# Patient Record
Sex: Male | Born: 1935 | Race: Black or African American | Hispanic: No | Marital: Married | Smoking: Never smoker
Health system: Southern US, Community
[De-identification: ages and names within clinical notes are randomized; demographics above are authoritative.]

## PROBLEM LIST (undated history)

## (undated) DIAGNOSIS — M109 Gout, unspecified: Secondary | ICD-10-CM

## (undated) DIAGNOSIS — I639 Cerebral infarction, unspecified: Secondary | ICD-10-CM

## (undated) DIAGNOSIS — M1990 Unspecified osteoarthritis, unspecified site: Secondary | ICD-10-CM

## (undated) DIAGNOSIS — T7840XA Allergy, unspecified, initial encounter: Secondary | ICD-10-CM

## (undated) HISTORY — DX: Cerebral infarction, unspecified: I63.9

## (undated) HISTORY — DX: Allergy, unspecified, initial encounter: T78.40XA

## (undated) HISTORY — DX: Unspecified osteoarthritis, unspecified site: M19.90

## (undated) HISTORY — PX: EYE SURGERY: SHX253

---

## 1999-01-25 ENCOUNTER — Encounter: Payer: Self-pay | Admitting: Emergency Medicine

## 1999-01-25 ENCOUNTER — Emergency Department (HOSPITAL_COMMUNITY): Admission: EM | Admit: 1999-01-25 | Discharge: 1999-01-26 | Payer: Self-pay | Admitting: Emergency Medicine

## 1999-05-10 ENCOUNTER — Encounter: Payer: Self-pay | Admitting: General Surgery

## 1999-05-10 ENCOUNTER — Encounter: Admission: RE | Admit: 1999-05-10 | Discharge: 1999-05-10 | Payer: Self-pay | Admitting: General Surgery

## 1999-05-11 ENCOUNTER — Ambulatory Visit (HOSPITAL_BASED_OUTPATIENT_CLINIC_OR_DEPARTMENT_OTHER): Admission: RE | Admit: 1999-05-11 | Discharge: 1999-05-11 | Payer: Self-pay | Admitting: General Surgery

## 2000-06-22 ENCOUNTER — Emergency Department (HOSPITAL_COMMUNITY): Admission: EM | Admit: 2000-06-22 | Discharge: 2000-06-22 | Payer: Self-pay | Admitting: Emergency Medicine

## 2000-06-22 ENCOUNTER — Encounter: Payer: Self-pay | Admitting: Emergency Medicine

## 2000-08-29 ENCOUNTER — Ambulatory Visit (HOSPITAL_COMMUNITY): Admission: RE | Admit: 2000-08-29 | Discharge: 2000-08-29 | Payer: Self-pay | Admitting: Cardiovascular Disease

## 2001-07-13 ENCOUNTER — Ambulatory Visit (HOSPITAL_COMMUNITY): Admission: RE | Admit: 2001-07-13 | Discharge: 2001-07-13 | Payer: Self-pay | Admitting: *Deleted

## 2001-07-13 ENCOUNTER — Encounter: Payer: Self-pay | Admitting: *Deleted

## 2003-09-28 ENCOUNTER — Ambulatory Visit: Admission: RE | Admit: 2003-09-28 | Discharge: 2003-12-27 | Payer: Self-pay | Admitting: Radiation Oncology

## 2003-10-17 ENCOUNTER — Ambulatory Visit: Admission: RE | Admit: 2003-10-17 | Discharge: 2003-10-17 | Payer: Self-pay | Admitting: Family Medicine

## 2003-10-25 ENCOUNTER — Encounter: Admission: RE | Admit: 2003-10-25 | Discharge: 2003-10-25 | Payer: Self-pay | Admitting: Urology

## 2003-11-22 ENCOUNTER — Ambulatory Visit (HOSPITAL_COMMUNITY): Admission: RE | Admit: 2003-11-22 | Discharge: 2003-11-22 | Payer: Self-pay | Admitting: Urology

## 2003-11-22 ENCOUNTER — Ambulatory Visit (HOSPITAL_BASED_OUTPATIENT_CLINIC_OR_DEPARTMENT_OTHER): Admission: RE | Admit: 2003-11-22 | Discharge: 2003-11-22 | Payer: Self-pay | Admitting: Urology

## 2003-12-28 ENCOUNTER — Ambulatory Visit: Admission: RE | Admit: 2003-12-28 | Discharge: 2004-01-11 | Payer: Self-pay | Admitting: Radiation Oncology

## 2005-12-02 IMAGING — CR DG CHEST 2V
2 series · 2 of 2 positions shown · non-contrast
Comparison: none

CLINICAL DATA: Prostate cancer.  Pre-op respiratory exam. 
 TWO VIEW CHEST: 
 Two views of the chest without prior films for comparison demonstrate cardiac enlargement.  The pulmonary hila are slightly prominent but I think this is just due to prominent pulmonary vascularity.  No definite adenopathy.  Slight increased vascular markings in the lower lung zones bilaterally but no infiltrates, edema, or effusions.  No pulmonary lesions.  No pleural effusions.  Bony structures are grossly normal .

[view not recorded (1 of 2)]
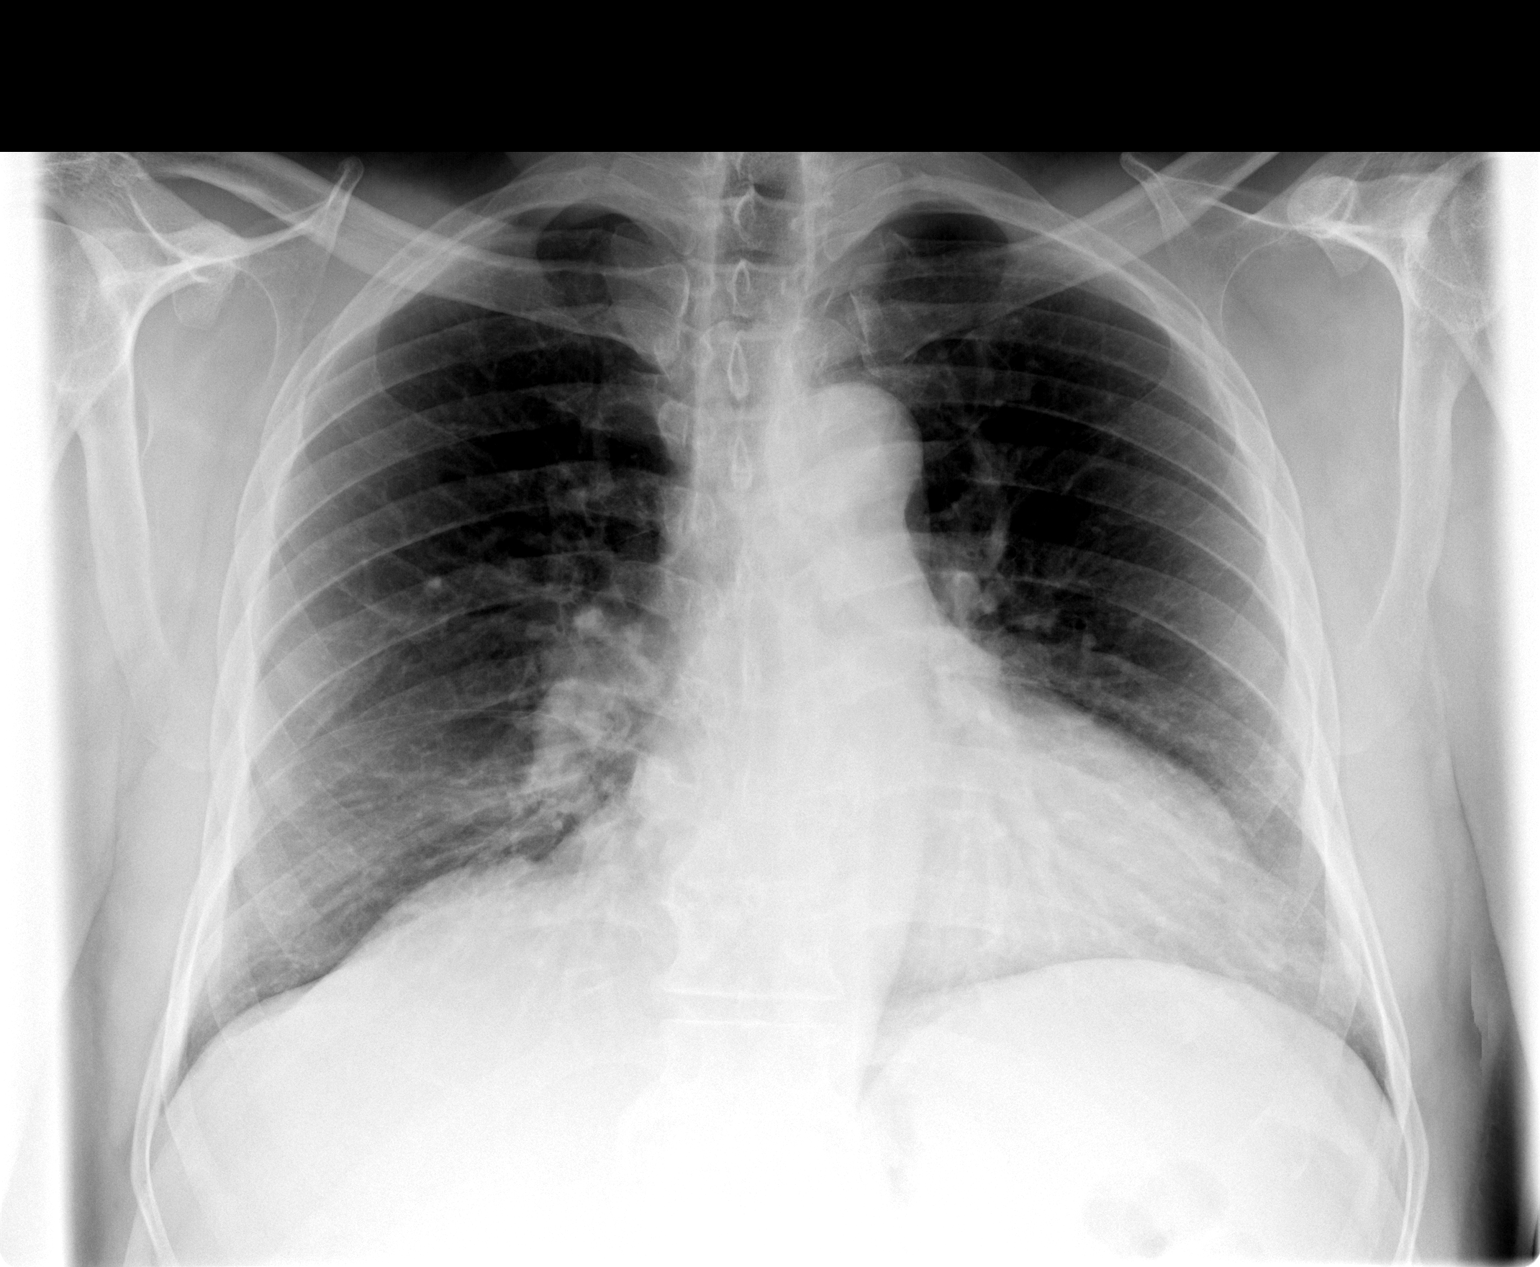

[view not recorded (2 of 2)]
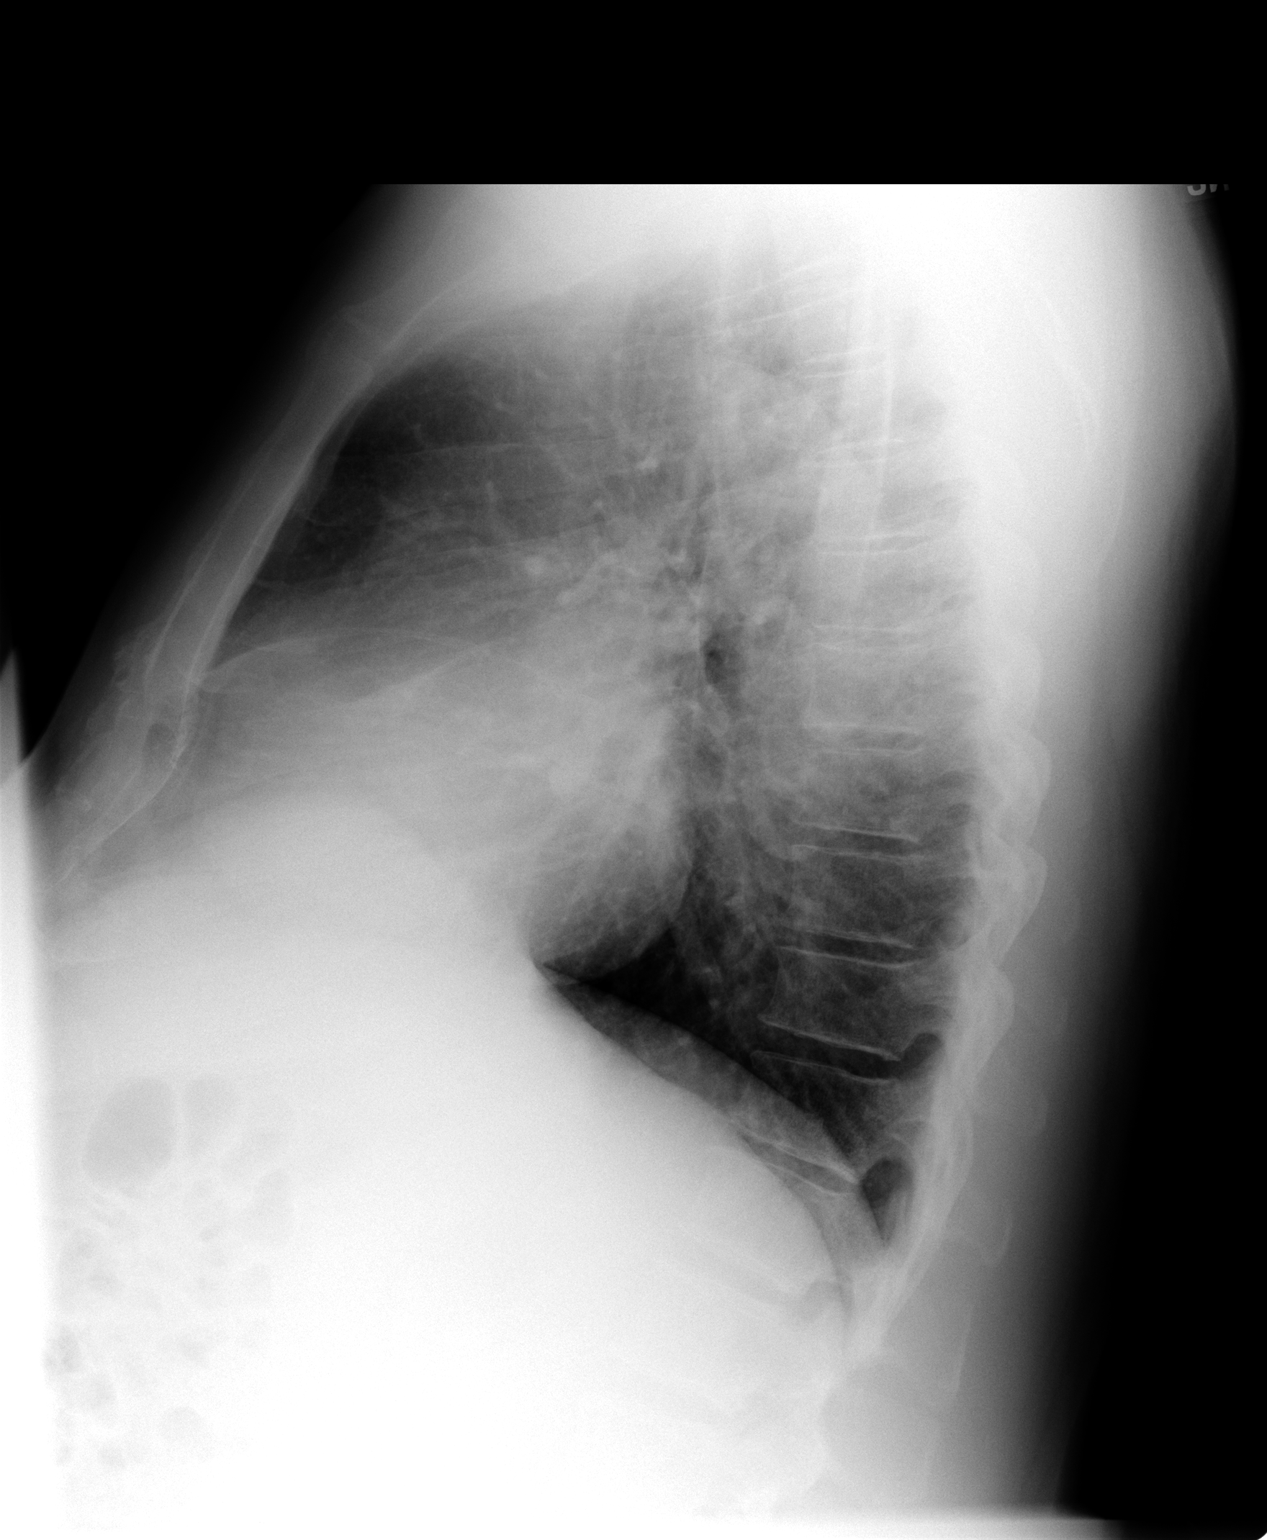

[2 of 2 positions shown; findings below may reference images not displayed]

IMPRESSION: 1.  Mild cardiac enlargement. 
 2.  Mild vascular prominence but no acute pulmonary findings.

## 2015-05-24 ENCOUNTER — Encounter (HOSPITAL_COMMUNITY): Payer: Self-pay | Admitting: Neurology

## 2015-05-24 ENCOUNTER — Emergency Department (HOSPITAL_COMMUNITY): Payer: Commercial Managed Care - HMO

## 2015-05-24 ENCOUNTER — Emergency Department (HOSPITAL_COMMUNITY)
Admit: 2015-05-24 | Discharge: 2015-05-24 | Disposition: A | Discharge status: Home / Self Care | Payer: Commercial Managed Care - HMO

## 2015-05-24 ENCOUNTER — Ambulatory Visit (INDEPENDENT_AMBULATORY_CARE_PROVIDER_SITE_OTHER): Payer: Commercial Managed Care - HMO | Admitting: Family Medicine

## 2015-05-24 ENCOUNTER — Inpatient Hospital Stay (HOSPITAL_COMMUNITY)
Admission: EM | Admit: 2015-05-24 | Discharge: 2015-05-26 | DRG: 554 | Disposition: A | Discharge status: Home / Self Care | Payer: Commercial Managed Care - HMO | Attending: Internal Medicine | Admitting: Internal Medicine

## 2015-05-24 VITALS — BP 136/84 | HR 65 | Temp 99.3°F | Resp 16

## 2015-05-24 DIAGNOSIS — Z8739 Personal history of other diseases of the musculoskeletal system and connective tissue: Secondary | ICD-10-CM

## 2015-05-24 DIAGNOSIS — M109 Gout, unspecified: Secondary | ICD-10-CM

## 2015-05-24 DIAGNOSIS — M659 Synovitis and tenosynovitis, unspecified: Secondary | ICD-10-CM | POA: Diagnosis present

## 2015-05-24 DIAGNOSIS — I499 Cardiac arrhythmia, unspecified: Secondary | ICD-10-CM

## 2015-05-24 DIAGNOSIS — Z79899 Other long term (current) drug therapy: Secondary | ICD-10-CM | POA: Diagnosis not present

## 2015-05-24 DIAGNOSIS — R0600 Dyspnea, unspecified: Secondary | ICD-10-CM

## 2015-05-24 DIAGNOSIS — M25431 Effusion, right wrist: Secondary | ICD-10-CM

## 2015-05-24 DIAGNOSIS — Z8673 Personal history of transient ischemic attack (TIA), and cerebral infarction without residual deficits: Secondary | ICD-10-CM | POA: Diagnosis not present

## 2015-05-24 DIAGNOSIS — M10021 Idiopathic gout, right elbow: Secondary | ICD-10-CM | POA: Diagnosis present

## 2015-05-24 DIAGNOSIS — R531 Weakness: Secondary | ICD-10-CM | POA: Diagnosis not present

## 2015-05-24 DIAGNOSIS — R6510 Systemic inflammatory response syndrome (SIRS) of non-infectious origin without acute organ dysfunction: Secondary | ICD-10-CM | POA: Diagnosis present

## 2015-05-24 DIAGNOSIS — M79601 Pain in right arm: Secondary | ICD-10-CM | POA: Diagnosis not present

## 2015-05-24 DIAGNOSIS — Z7982 Long term (current) use of aspirin: Secondary | ICD-10-CM | POA: Diagnosis not present

## 2015-05-24 DIAGNOSIS — Z8639 Personal history of other endocrine, nutritional and metabolic disease: Secondary | ICD-10-CM

## 2015-05-24 DIAGNOSIS — R8290 Unspecified abnormal findings in urine: Secondary | ICD-10-CM | POA: Diagnosis not present

## 2015-05-24 DIAGNOSIS — M1990 Unspecified osteoarthritis, unspecified site: Secondary | ICD-10-CM | POA: Diagnosis present

## 2015-05-24 DIAGNOSIS — R509 Fever, unspecified: Secondary | ICD-10-CM | POA: Diagnosis present

## 2015-05-24 HISTORY — DX: Gout, unspecified: M10.9

## 2015-05-24 LAB — POCT CBC
Granulocyte percent: 69.5 %G (ref 37–80)
HCT, POC: 38.9 % — AB (ref 43.5–53.7)
Hemoglobin: 13.4 g/dL — AB (ref 14.1–18.1)
Lymph, poc: 1.2 (ref 0.6–3.4)
MCH, POC: 27.6 pg (ref 27–31.2)
MCHC: 34.3 g/dL (ref 31.8–35.4)
MCV: 80.4 fL (ref 80–97)
MID (cbc): 0.6 (ref 0–0.9)
MPV: 7 fL (ref 0–99.8)
POC Granulocyte: 4.2 (ref 2–6.9)
POC LYMPH PERCENT: 20.7 %L (ref 10–50)
POC MID %: 9.8 %M (ref 0–12)
Platelet Count, POC: 144 10*3/uL (ref 142–424)
RBC: 4.84 M/uL (ref 4.69–6.13)
RDW, POC: 17 %
WBC: 6 10*3/uL (ref 4.6–10.2)

## 2015-05-24 LAB — I-STAT TROPONIN, ED: Troponin i, poc: 0 ng/mL (ref 0.00–0.08)

## 2015-05-24 LAB — URINE MICROSCOPIC-ADD ON: BACTERIA UA: NONE SEEN

## 2015-05-24 LAB — SYNOVIAL CELL COUNT + DIFF, W/ CRYSTALS
MONOCYTE-MACROPHAGE-SYNOVIAL FLUID: 4 % — AB (ref 50–90)
NEUTROPHIL, SYNOVIAL: 96 % — AB (ref 0–25)
WBC, Synovial: 10530 /mm3 — ABNORMAL HIGH (ref 0–200)

## 2015-05-24 LAB — COMPREHENSIVE METABOLIC PANEL
ALT: 27 U/L (ref 17–63)
AST: 57 U/L — AB (ref 15–41)
Albumin: 3.5 g/dL (ref 3.5–5.0)
Alkaline Phosphatase: 59 U/L (ref 38–126)
Anion gap: 10 (ref 5–15)
BUN: 14 mg/dL (ref 6–20)
CHLORIDE: 99 mmol/L — AB (ref 101–111)
CO2: 29 mmol/L (ref 22–32)
CREATININE: 1.07 mg/dL (ref 0.61–1.24)
Calcium: 8.9 mg/dL (ref 8.9–10.3)
GFR calc Af Amer: >60 mL/min (ref >60)
GFR calc non Af Amer: >60 mL/min (ref >60)
Glucose, Bld: 108 mg/dL — ABNORMAL HIGH (ref 65–99)
POTASSIUM: 3.5 mmol/L (ref 3.5–5.1)
SODIUM: 138 mmol/L (ref 135–145)
Total Bilirubin: 2.2 mg/dL — ABNORMAL HIGH (ref 0.3–1.2)
Total Protein: 7.1 g/dL (ref 6.5–8.1)

## 2015-05-24 LAB — GRAM STAIN

## 2015-05-24 LAB — URINALYSIS, ROUTINE W REFLEX MICROSCOPIC
GLUCOSE, UA: NEGATIVE mg/dL
Ketones, ur: 40 mg/dL — AB
Nitrite: NEGATIVE
PH: 5.5 (ref 5.0–8.0)
PROTEIN: 100 mg/dL — AB
Specific Gravity, Urine: 1.023 (ref 1.005–1.030)

## 2015-05-24 LAB — CBC WITH DIFFERENTIAL/PLATELET
BASOS ABS: 0 10*3/uL (ref 0.0–0.1)
Basophils Relative: 0 %
EOS ABS: 0 10*3/uL (ref 0.0–0.7)
Eosinophils Relative: 0 %
HCT: 39.7 % (ref 39.0–52.0)
Hemoglobin: 12.6 g/dL — ABNORMAL LOW (ref 13.0–17.0)
LYMPHS ABS: 1 10*3/uL (ref 0.7–4.0)
LYMPHS PCT: 19 %
MCH: 26.9 pg (ref 26.0–34.0)
MCHC: 31.7 g/dL (ref 30.0–36.0)
MCV: 84.6 fL (ref 78.0–100.0)
MONOS PCT: 9 %
Monocytes Absolute: 0.5 10*3/uL (ref 0.1–1.0)
NEUTROS PCT: 72 %
Neutro Abs: 3.8 10*3/uL (ref 1.7–7.7)
PLATELETS: 131 10*3/uL — AB (ref 150–400)
RBC: 4.69 MIL/uL (ref 4.22–5.81)
RDW: 15.6 % — AB (ref 11.5–15.5)
WBC: 5.3 10*3/uL (ref 4.0–10.5)

## 2015-05-24 LAB — GLUCOSE, POCT (MANUAL RESULT ENTRY): POC Glucose: 124 mg/dl — AB (ref 70–99)

## 2015-05-24 LAB — I-STAT CG4 LACTIC ACID, ED
LACTIC ACID, VENOUS: 0.68 mmol/L (ref 0.5–2.0)
Lactic Acid, Venous: 1.21 mmol/L (ref 0.5–2.0)

## 2015-05-24 MED ORDER — ASPIRIN EC 81 MG PO TBEC
162.0000 mg | DELAYED_RELEASE_TABLET | Freq: Every day | ORAL | Status: DC
  Administered 2015-05-25 – 2015-05-26 (×2): 162 mg via ORAL
  Filled 2015-05-24 (×3): qty 2

## 2015-05-24 MED ORDER — VANCOMYCIN HCL IN DEXTROSE 750-5 MG/150ML-% IV SOLN
750.0000 mg | Freq: Two times a day (BID) | INTRAVENOUS | Status: DC
  Administered 2015-05-25 – 2015-05-26 (×3): 750 mg via INTRAVENOUS
  Filled 2015-05-24 (×4): qty 150

## 2015-05-24 MED ORDER — MORPHINE SULFATE (PF) 2 MG/ML IV SOLN: 1.0000 mg | INTRAVENOUS | Status: DC | PRN

## 2015-05-24 MED ORDER — SODIUM CHLORIDE 0.9 % IV BOLUS (SEPSIS)
1000.0000 mL | Freq: Once | INTRAVENOUS | Status: AC
  Administered 2015-05-24: 1000 mL via INTRAVENOUS

## 2015-05-24 MED ORDER — ACETAMINOPHEN 325 MG PO TABS: 650.0000 mg | ORAL_TABLET | Freq: Four times a day (QID) | ORAL | Status: DC | PRN

## 2015-05-24 MED ORDER — METHYLPREDNISOLONE SODIUM SUCC 40 MG IJ SOLR
40.0000 mg | Freq: Every day | INTRAMUSCULAR | Status: DC
  Administered 2015-05-25: 40 mg via INTRAVENOUS
  Filled 2015-05-24: qty 1

## 2015-05-24 MED ORDER — ONDANSETRON HCL 4 MG PO TABS: 4.0000 mg | ORAL_TABLET | Freq: Four times a day (QID) | ORAL | Status: DC | PRN

## 2015-05-24 MED ORDER — VANCOMYCIN HCL IN DEXTROSE 1-5 GM/200ML-% IV SOLN
1000.0000 mg | Freq: Once | INTRAVENOUS | Status: AC
  Administered 2015-05-24: 1000 mg via INTRAVENOUS
  Filled 2015-05-24: qty 200

## 2015-05-24 MED ORDER — ACETAMINOPHEN 650 MG RE SUPP: 650.0000 mg | Freq: Four times a day (QID) | RECTAL | Status: DC | PRN

## 2015-05-24 MED ORDER — ONDANSETRON HCL 4 MG/2ML IJ SOLN: 4.0000 mg | Freq: Four times a day (QID) | INTRAMUSCULAR | Status: DC | PRN

## 2015-05-24 MED ORDER — LIDOCAINE HCL 1 % IJ SOLN
20.0000 mL | Freq: Once | INTRAMUSCULAR | Status: AC
  Administered 2015-05-24: 20 mL via INTRADERMAL
  Filled 2015-05-24 (×2): qty 20

## 2015-05-24 MED ORDER — SODIUM CHLORIDE 0.9 % IV SOLN
INTRAVENOUS | Status: AC
  Administered 2015-05-25 (×2): via INTRAVENOUS

## 2015-05-24 MED ORDER — ENOXAPARIN SODIUM 40 MG/0.4ML ~~LOC~~ SOLN
40.0000 mg | Freq: Every day | SUBCUTANEOUS | Status: DC
  Administered 2015-05-25 – 2015-05-26 (×2): 40 mg via SUBCUTANEOUS
  Filled 2015-05-24 (×4): qty 0.4

## 2015-05-24 NOTE — Consult Note (Signed)
Reason for Consult: Right elbow pain Referring Physician: [name] is an 79 y.o. male.  HPI: Trevor Bishop is a 79 year old patient with two-week history of H medical onset right elbow pain. Has a known history of gout and has had a gout flare for the past "month" in his feet as well as in his hands. This history is provided by both the patient as well as his daughters who are here with him. He denies any fever or chills but just reports generally progressive right elbow pain. He has not been on antibiotics  Past Medical History  Diagnosis Date  . Allergy   . Arthritis   . Stroke [facility])     Past Surgical History  Procedure Laterality Date  . Eye surgery      No family history on file.  Social History:  reports that he has never smoked. He does not have any smokeless tobacco history on file. He reports that he does not drink alcohol. His drug history is not on file.  Allergies: No Known Allergies  Medications: I have reviewed the patient's current medications.  Results for orders placed or performed during the hospital encounter of 05/24/15 (from the past 48 hour(s))  CBC with Differential     Status: Abnormal   Collection Time: 05/24/15  3:00 PM  Result Value Ref Range   WBC 5.3 4.0 - 10.5 K/uL   RBC 4.69 4.22 - 5.81 MIL/uL   Hemoglobin 12.6 (L) 13.0 - 17.0 g/dL   HCT 39.7 39.0 - 52.0 %   MCV 84.6 78.0 - 100.0 fL   MCH 26.9 26.0 - 34.0 pg   MCHC 31.7 30.0 - 36.0 g/dL   RDW 15.6 (H) 11.5 - 15.5 %   Platelets 131 (L) 150 - 400 K/uL   Neutrophils Relative % 72 %   Neutro Abs 3.8 1.7 - 7.7 K/uL   Lymphocytes Relative 19 %   Lymphs Abs 1.0 0.7 - 4.0 K/uL   Monocytes Relative 9 %   Monocytes Absolute 0.5 0.1 - 1.0 K/uL   Eosinophils Relative 0 %   Eosinophils Absolute 0.0 0.0 - 0.7 K/uL   Basophils Relative 0 %   Basophils Absolute 0.0 0.0 - 0.1 K/uL  Comprehensive metabolic panel     Status: Abnormal   Collection Time: 05/24/15  3:00 PM  Result Value Ref Range    Sodium 138 135 - 145 mmol/L   Potassium 3.5 3.5 - 5.1 mmol/L   Chloride 99 (L) 101 - 111 mmol/L   CO2 29 22 - 32 mmol/L   Glucose, Bld 108 (H) 65 - 99 mg/dL   BUN 14 6 - 20 mg/dL   Creatinine, Ser 1.07 0.61 - 1.24 mg/dL   Calcium 8.9 8.9 - 10.3 mg/dL   Total Protein 7.1 6.5 - 8.1 g/dL   Albumin 3.5 3.5 - 5.0 g/dL   AST 57 (H) 15 - 41 U/L   ALT 27 17 - 63 U/L   Alkaline Phosphatase 59 38 - 126 U/L   Total Bilirubin 2.2 (H) 0.3 - 1.2 mg/dL   GFR calc non Af Amer >60 >60 mL/min   GFR calc Af Amer >60 >60 mL/min    Comment: (NOTE) The eGFR has been calculated using the CKD EPI equation. This calculation has not been validated in all clinical situations. eGFR's persistently <60 mL/min signify possible Chronic Kidney Disease.    Anion gap 10 5 - 15  I-stat troponin, ED     Status:  None   Collection Time: 05/24/15  3:08 PM  Result Value Ref Range   Troponin i, poc 0.00 0.00 - 0.08 ng/mL   Comment 3            Comment: Due to the release kinetics of cTnI, a negative result within the first hours of the onset of symptoms does not rule out myocardial infarction with certainty. If myocardial infarction is still suspected, repeat the test at appropriate intervals.   I-Stat CG4 Lactic Acid, ED  (not at  [facility])     Status: None   Collection Time: 05/24/15  3:10 PM  Result Value Ref Range   Lactic Acid, Venous 1.21 0.5 - 2.0 mmol/L    Dg Chest 1 View  05/24/2015  CLINICAL DATA:  Gout attack. EXAM: CHEST 1 VIEW COMPARISON:  Oct 25, 2003. FINDINGS: Stable cardiomediastinal silhouette. No pneumothorax or pleural effusion is noted. No acute pulmonary disease is noted. Bony thorax is intact. IMPRESSION: No acute cardiopulmonary abnormality seen. Electronically Signed   By: Marijo Conception, M.D.   On: 05/24/2015 15:30   Dg Elbow Complete Right  05/24/2015  CLINICAL DATA:  79 year old male with right elbow pain and swelling radiating to the humerus. Patient reports a gout. Initial  encounter. EXAM: RIGHT ELBOW - COMPLETE 3+ VIEW COMPARISON:  None. FINDINGS: Evidence of joint effusion. Generalized soft tissue swelling and stranding about the right elbow. Distal right humerus appears intact. There are small degenerative or posttraumatic ossific fragments about the medial epicondyles. Proximal radius and ulna appear intact. No subcutaneous gas. No radiopaque foreign body identified. IMPRESSION: Moderate to severe soft tissue swelling with evidence of joint effusion, but no acute osseous abnormality identified. Electronically Signed   By: Genevie Ann M.D.   On: 05/24/2015 15:27    Review of Systems  Constitutional: Positive for malaise/fatigue.  HENT: Negative.   Eyes: Negative.   Respiratory: Negative.   Cardiovascular: Negative.   Gastrointestinal: Negative.   Genitourinary: Negative.   Musculoskeletal: Positive for joint pain.  Skin: Negative.   Neurological: Negative.   Endo/Heme/Allergies: Negative.   Psychiatric/Behavioral: Negative.    Blood pressure 128/76, pulse 103, temperature 101 F (38.3 C), temperature source Rectal, resp. rate 12, SpO2 95 %. Physical Exam  Constitutional: He appears well-developed.  HENT:  Head: Normocephalic.  Eyes: Pupils are equal, round, and reactive to light.  Neck: Normal range of motion.  Cardiovascular: Normal rate.   Respiratory: Effort normal.  Neurological: He is alert.  Skin: Skin is warm.  Psychiatric: He has a normal mood and affect.   examination of the right elbow demonstrates painful range of motion including pronation supination there is no definitive olecranon fossa fluid collection. There is redness and warmth to the elbow. He also has some swelling in the right wrist which is not present in the left wrist. No proximal lymphadenopathy in the right arm is present grip EPL FPL interosseous is intact no other masses lymphadenopathy or skin vision in the right elbow region  Assessment/Plan: Impression is gout which is  most likely versus infection in the right elbow. Patient does not have a white count but he does have an intra-articular effusion. Plan is to aspirate the joint confirmed that its gout and likely have medical team initiate gout treatment. If his infection and we will proceed with washout but again without fevers chills or white count I think that's highly unlikely especially with 2 week course. The risk and benefits of aspiration injection discussed with the patient  on alcohol elbow joint aspirated the lower approach will see him back in follow-up after analysis performed on the fluid  DEAN,GREGORY SCOTT 05/24/2015, 5:24 PM

## 2015-05-24 NOTE — H&P (Signed)
[facility] History and Physical  Trevor GhaziJesse R Hovater ONG:[id number] DOB: Aug 30, 1935 DOA: 05/24/2015  Referring physician: Ms.Westfall. PCP: No primary care provider on file. [facility]. Specialists: None.  Chief Complaint: Right pain and swelling around the elbow area.  HPI: Trevor Bishop is a 79 y.o. male history of gout presents to the ER because of increasing pain and swelling around the right elbow area and upper extremity. Patient has been having progressive pain and swelling over the last 2 weeks. Patient has history of gout and was prescribed allopurinol 2 months ago but patient stopped taking because of patient felt that his pain increased on taking allopurinol. Patient takes NSAIDs as needed. In the ER patient also was found to be tachycardic and febrile. There was concern for septic joint and orthopedic on-call Dr. August Saucerean was consulted and patient had aspiration of the right elbow joint which is negative for any organism and shows crystals concerning for gout. In addition patient's urine is also concerning for UTI. Patient has been admitted for further management of acute gout with SIRS.   Review of Systems: As presented in the history of presenting illness, rest negative.  Past Medical History  Diagnosis Date  . Allergy   . Arthritis   . Stroke (HCC)   . Gout    Past Surgical History  Procedure Laterality Date  . Eye surgery     Social History:  reports that he has never smoked. He does not have any smokeless tobacco history on file. He reports that he does not drink alcohol. His drug history is not on file. Where does patient live home. Can patient participate in ADLs? Yes.  No Known Allergies  Family History:  Family History  Problem Relation Age of Onset  . Gout Neg Hx   . Diabetes Mellitus II Neg Hx       Prior to Admission medications   Medication Sig Start Date End Date Taking? Authorizing Provider  aspirin EC 81 MG tablet Take 162 mg by mouth daily.    Yes Historical Provider, MD  Menthol, Topical Analgesic, (ICY HOT POWER) 16 % GEL Apply 1 application topically daily as needed (general pain / arm).   Yes Historical Provider, MD  naproxen sodium (ANAPROX) 220 MG tablet Take 440 mg by mouth daily as needed (general pain).   Yes Historical Provider, MD  OVER THE COUNTER MEDICATION Take 1 tablet by mouth daily. "Prostratus"   Yes Historical Provider, MD    Physical Exam: Filed Vitals:   05/24/15 2130 05/24/15 2153 05/24/15 2218 05/24/15 2233  BP: 117/68  157/74   Pulse: 95  102   Temp:  99.8 F (37.7 C) 98.5 F (36.9 C)   TempSrc:  Rectal Oral   Resp: 18  18   Height:      Weight:    87.9 kg (193 lb 12.6 oz)  SpO2: 96%  98%      General:  Moderately built and nourished.  Eyes: Anicteric no pallor.  ENT: No discharge from the ears eyes nose and mouth.  Neck: No mass felt.  Cardiovascular: S1-S2 heard.  Respiratory: No rhonchi or crepitations.  Abdomen: Soft nontender bowel sounds present.  Skin: No rash.  Musculoskeletal: Swelling and tenderness of the right upper extremity. With patient having significant pain on moving right upper extremity. Swelling is mostly in the right elbow area.  Psychiatric: Appears normal.  Neurologic: Alert awake oriented to time place and person. Moves all extremities.  Labs on Admission:  Basic Metabolic Panel:  Recent Labs Lab 05/24/15 1500  NA 138  K 3.5  CL 99*  CO2 29  GLUCOSE 108*  BUN 14  CREATININE 1.07  CALCIUM 8.9   Liver Function Tests:  Recent Labs Lab 05/24/15 1500  AST 57*  ALT 27  ALKPHOS 59  BILITOT 2.2*  PROT 7.1  ALBUMIN 3.5   No results for input(s): LIPASE, AMYLASE in the last 168 hours. No results for input(s): AMMONIA in the last 168 hours. CBC:  Recent Labs Lab 05/24/15 1219 05/24/15 1500  WBC 6.0 5.3  NEUTROABS  --  3.8  HGB 13.4* 12.6*  HCT 38.9* 39.7  MCV 80.4 84.6  PLT  --  131*   Cardiac Enzymes: No results for input(s):  CKTOTAL, CKMB, CKMBINDEX, TROPONINI in the last 168 hours.  BNP (last 3 results) No results for input(s): BNP in the last 8760 hours.  ProBNP (last 3 results) No results for input(s): PROBNP in the last 8760 hours.  CBG: No results for input(s): GLUCAP in the last 168 hours.  Radiological Exams on Admission: Dg Chest 1 View  05/24/2015  CLINICAL DATA:  Gout attack. EXAM: CHEST 1 VIEW COMPARISON:  Oct 25, 2003. FINDINGS: Stable cardiomediastinal silhouette. No pneumothorax or pleural effusion is noted. No acute pulmonary disease is noted. Bony thorax is intact. IMPRESSION: No acute cardiopulmonary abnormality seen. Electronically Signed   By: Lupita Raider, M.D.   On: 05/24/2015 15:30   Dg Elbow Complete Right  05/24/2015  CLINICAL DATA:  79 year old male with right elbow pain and swelling radiating to the humerus. Patient reports a gout. Initial encounter. EXAM: RIGHT ELBOW - COMPLETE 3+ VIEW COMPARISON:  None. FINDINGS: Evidence of joint effusion. Generalized soft tissue swelling and stranding about the right elbow. Distal right humerus appears intact. There are small degenerative or posttraumatic ossific fragments about the medial epicondyles. Proximal radius and ulna appear intact. No subcutaneous gas. No radiopaque foreign body identified. IMPRESSION: Moderate to severe soft tissue swelling with evidence of joint effusion, but no acute osseous abnormality identified. Electronically Signed   By: Odessa Fleming M.D.   On: 05/24/2015 15:27     Assessment/Plan Principal Problem:   SIRS (systemic inflammatory response syndrome) (HCC) Active Problems:   Fever   Gout attack   Acute gout   1. SIRS secondary to acute gouty arthritis involving the right elbow and also possible UTI - until blood cultures and urine cultures are final patient will be on empiric antibiotics. I have also place patient on IV Solu-Medrol for acute gouty arthritis involving the right elbow. Check uric acid levels.  Patient eventually will need to be on uric acid decreasing agents. Since patient is not able to tolerate allopurinol patient probably should be on Uloric if patient's uric acid level is high. Check Dopplers to rule out DVT. 2. Mildly elevated AST - follow-up as outpatient.   DVT Prophylaxis Lovenox.  Code Status: Full code.  Family Communication: Discussed with patient.  Disposition Plan: Admit to inpatient.    Benoit Meech N. [facility] Pager [phone].  If 7PM-7AM, please contact night-coverage [email or web address] Password [email or web address] 05/24/2015, 11:51 PM

## 2015-05-24 NOTE — ED Notes (Signed)
Trevor BloodgoodDonald Bishop (cousin) phone number- [phone]

## 2015-05-24 NOTE — Progress Notes (Addendum)
Subjective:  This chart was scribed for Meredith StaggersJeffrey Shevy Yaney, MD by Stann Oresung-Kai Tsai, Medical Scribe. This patient was seen in room 6 and the patient's care was started 11:36 AM.   Patient ID: Trevor Bishop, male    DOB: [birth year], 79 y.o.   MRN: [id number] Chief Complaint  Patient presents with  . gout flare up    all over    HPI Trevor Bishop is a 79 y.o. male Pt with history of gout is new patient to our office complaining of pain all over that started 3 weeks ago. He was seen by Dr. Renato Gailseed a year ago. His flare up at that time occurred in his leg and his hand. He is taking medication for enlarged prostate. He denies DM.   Pt who is brought in by his cousin states that he has pain in both of his hands with intermittent aching pain in both arms and pain in both legs. He notes some weakness in both legs and his right arm. He was unable to get out of his bed this morning. He usually walks on his own but today he needed assistance of being in a wheelchair. He mentions shortness of breath when getting out of bed starting 2 weeks ago. He isn't able to put any weight on his legs.   He has a history of a stroke about 10 years ago. He denied weakness in his arms and legs at that time. The onset at the time was with slurred speech with abnormal movement. He has headaches occasionally. He denies slurred speech recently. He denies chest pain, fever, chills and dizziness. He also has a tremor in his left hand that's been ongoing for 3 years.   There are no active problems to display for this patient.  Past Medical History  Diagnosis Date  . Allergy   . Arthritis   . Stroke [facility](HCC)    Past Surgical History  Procedure Laterality Date  . Eye surgery     No Known Allergies Prior to Admission medications   Not on File   Social History   Social History  . Marital Status: Married    Spouse Name: N/A  . Number of Children: N/A  . Years of Education: N/A   Occupational History  . Not on file.    Social History Main Topics  . Smoking status: Never Smoker   . Smokeless tobacco: Not on file  . Alcohol Use: Not on file  . Drug Use: Not on file  . Sexual Activity: Not on file   Other Topics Concern  . Not on file   Social History Narrative  . No narrative on file   Review of Systems  Constitutional: Positive for fatigue. Negative for fever and chills.  Respiratory: Positive for shortness of breath. Negative for cough.   Cardiovascular: Negative for chest pain.  Musculoskeletal: Positive for myalgias, arthralgias and gait problem. Negative for back pain.  Neurological: Positive for tremors (left hand), weakness and headaches. Negative for dizziness, syncope and speech difficulty.       Objective:   Physical Exam  Constitutional: He is oriented to person, place, and time. He appears well-developed and well-nourished.  HENT:  Head: Normocephalic and atraumatic.  Eyes: EOM are normal. Pupils are equal, round, and reactive to light.  Neck: Neck supple.  Cardiovascular: Regular rhythm.   Occasional extrasystoles are present. Bradycardia present.   Cap refill <1sec in right finger tips  Pulmonary/Chest: Effort normal. No respiratory distress. He has no  wheezes.  Few coarse Bishop sounds  Musculoskeletal:  Right hand: minimal movement, warmth and swelling in right wrist and hand Dorsal plantar flexion and extension, resistant to standing for further testing Grossly lower extremities equal Right arm: erythema with warmth spreading up the forearm to proximal elbow, mostly laterally  Neurological: He is alert and oriented to person, place, and time.  Equal facial movements, no drooping, speech is intelligible, tremor in left hand  Skin: Skin is warm and dry.  Nursing note and vitals reviewed.   Filed Vitals:   05/24/15 1121  BP: 136/84  Pulse: 65  Temp: 99.3 F (37.4 C)  TempSrc: Oral  Resp: 16  SpO2: 93%   EKG: sinus tachycardia - rate 108, frequent PVC's.     Results for orders placed or performed in visit on 05/24/15  POCT CBC  Result Value Ref Range   WBC 6.0 4.6 - 10.2 K/uL   Lymph, poc 1.2 0.6 - 3.4   POC LYMPH PERCENT 20.7 10 - 50 %L   MID (cbc) 0.6 0 - 0.9   POC MID % 9.8 0 - 12 %M   POC Granulocyte 4.2 2 - 6.9   Granulocyte percent 69.5 37 - 80 %G   RBC 4.84 4.69 - 6.13 M/uL   Hemoglobin 13.4 (A) 14.1 - 18.1 g/dL   HCT, POC 96.0 (A) 45.4 - 53.7 %   MCV 80.4 80 - 97 fL   MCH, POC 27.6 27 - 31.2 pg   MCHC 34.3 31.8 - 35.4 g/dL   RDW, POC 09.8 %   Platelet Count, POC 144 142 - 424 K/uL   MPV 7.0 0 - 99.8 fL  POCT glucose (manual entry)  Result Value Ref Range   POC Glucose 124 (A) 70 - 99 mg/dl   11:91 PM Repeat O2 sat 90% on room air. He is complaining of some shortness of breath at rest at this time. Will place on oxygen 2 L nasal cannula. EMS called for transport, placed on heart monitor, charge nurse in ER advised.      Assessment & Plan:  Trevor Bishop is a 79 y.o. male Arm pain, right  Swelling of wrist joint, right  History of gout  General weakness - Plan: POCT CBC, POCT glucose (manual entry), Care order/instruction  Irregular heart beat - Plan: EKG 12-Lead  Dyspnea   79 year old male with past medical history of reported gout,possible BPH (on some prostate medicine but not sure of this today), remote history of stroke without residual deficits, no other chronic medications or medical problems known. Presents with three-week history of progressive weakness generalized, but mostly lower extremity bilaterally. Also has right arm pain right wrist pain and swelling that started over that same time period, bone exam has significant edema erythema up his right arm concerning for possible cellulitis. Episodic dyspnea first in the morning, no known cardiac disease or congestive heart failure, no known chronic kidney or other chronic disease.  He is limited to wheelchair for exam which is different than his usual status  based on history from his cousin who is here with him today.   -Differential diagnosis includes initial possible gout versus CVD and remote CVA, versus infection/cellulitis in his right wrist and arm and systemic inflammatory response. His temp is borderline today 99.3, but no leukocytosis on CBC. Glucose mildly elevated.   - Episodic dyspnea, and is dyspneic in the office. O2 sat was on the low side between 90-93% room air. As he was actively  dyspneic, placed on oxygen 2 L nasal cannula. Few coarse breath sounds lower lobes bilaterally, differential includes pneumonia or congestive heart failure. Further evaluation through the emergency room.  -EMS called for transport, charge nurse advised. IV was placed, O2 nasal cannula 2liters, and heart monitor placed.  12:47 PM Report given with transfer of care to EMS.    No orders of the defined types were placed in this encounter.   There are no Patient Instructions on file for this visit.      By signing my name below, I, Stann Ore, attest that this documentation has been prepared under the direction and in the presence of Meredith Staggers, MD. Electronically Signed: Stann Ore, Scribe. 05/24/2015 , 11:36 AM .  I personally performed the services described in this documentation, which was scribed in my presence. The recorded information has been reviewed and considered, and addended by me as needed.

## 2015-05-24 NOTE — ED Provider Notes (Signed)
CSN: [id number]     Arrival date & time 05/24/15  1338 History   First MD Initiated Contact with Patient 05/24/15 1341     Chief Complaint  Patient presents with  . Arm Swelling  . Weakness    HPI   Trevor Bishop is a 79 y.o. male with a PMH of CVA who presents to the ED with generalized weakness x 3 days and right elbow pain and swelling x 2 weeks, worse over the past 2 days. He was seen at urgent care today and subsequently sent to the ED. He reports generalized weakness for the past few days, and states he urinated in bed due to not being able to get up to use the bathroom. His daughter in law is present at bedside, who states he has required help with getting up from sitting, but otherwise seems to be at his baseline. In addition, he reports edema and erythema to his right elbow, which he states is consistent with his gout flares. He denies exacerbating or alleviating factors. He denies fever, chills, chest pain, shortness of breath, abdominal pain, N/V, numbness, weakness, paresthesia, recent falls.   Past Medical History  Diagnosis Date  . Allergy   . Arthritis   . Stroke [facility])    Past Surgical History  Procedure Laterality Date  . Eye surgery     No family history on file. Social History  Substance Use Topics  . Smoking status: Never Smoker   . Smokeless tobacco: None  . Alcohol Use: No      Review of Systems  Constitutional: Positive for fatigue. Negative for fever and chills.  Respiratory: Negative for shortness of breath.   Cardiovascular: Negative for chest pain.  Gastrointestinal: Negative for nausea, vomiting, abdominal pain, diarrhea and constipation.  Genitourinary: Negative for dysuria, urgency and frequency.  Musculoskeletal: Positive for arthralgias.  Skin: Positive for color change.  Neurological: Negative for dizziness, syncope, weakness, light-headedness, numbness and headaches.  All other systems reviewed and are negative.     Allergies  Review of  patient's allergies indicates no known allergies.  Home Medications   Prior to Admission medications   Medication Sig Start Date End Date Taking? Authorizing Provider  aspirin EC 81 MG tablet Take 162 mg by mouth daily.   Yes Historical Provider, MD  Menthol, Topical Analgesic, (ICY HOT POWER) 16 % GEL Apply 1 application topically daily as needed (general pain / arm).   Yes Historical Provider, MD  naproxen sodium (ANAPROX) 220 MG tablet Take 440 mg by mouth daily as needed (general pain).   Yes Historical Provider, MD  OVER THE COUNTER MEDICATION Take 1 tablet by mouth daily. "Prostratus"   Yes Historical Provider, MD    BP 119/67 mmHg  Pulse 95  Temp(Src) 101 F (38.3 C) (Rectal)  Resp 13  SpO2 94% Physical Exam  Constitutional: He is oriented to person, place, and time. He appears well-developed and well-nourished. No distress.  HENT:  Head: Normocephalic and atraumatic.  Right Ear: External ear normal.  Left Ear: External ear normal.  Nose: Nose normal.  Mouth/Throat: Uvula is midline, oropharynx is clear and moist and mucous membranes are normal.  Eyes: Conjunctivae, EOM and lids are normal. Pupils are equal, round, and reactive to light. Right eye exhibits no discharge. Left eye exhibits no discharge. No scleral icterus.  Neck: Normal range of motion. Neck supple.  Cardiovascular: Normal rate, regular rhythm, normal heart sounds, intact distal pulses and normal pulses.   Pulmonary/Chest: Effort normal  and breath sounds normal. No respiratory distress. He has no wheezes. He has no rales.  Abdominal: Soft. Normal appearance and bowel sounds are normal. He exhibits no distension and no mass. There is no tenderness. There is no rigidity, no rebound and no guarding.  Musculoskeletal: He exhibits edema and tenderness.  Edema, erythema, heat, and decreased ROM to right elbow. Strength and sensation intact. Distal pulses intact.  Neurological: He is alert and oriented to person,  place, and time. He has normal strength. No cranial nerve deficit or sensory deficit.  Skin: Skin is warm, dry and intact. No rash noted. He is not diaphoretic. There is erythema. No pallor.  Psychiatric: He has a normal mood and affect. His speech is normal and behavior is normal.  Nursing note and vitals reviewed.   ED Course  Procedures (including critical care time)  Labs Review Labs Reviewed  CBC WITH DIFFERENTIAL/PLATELET - Abnormal; Notable for the following:    Hemoglobin 12.6 (*)    RDW 15.6 (*)    Platelets 131 (*)    All other components within normal limits  COMPREHENSIVE METABOLIC PANEL - Abnormal; Notable for the following:    Chloride 99 (*)    Glucose, Bld 108 (*)    AST 57 (*)    Total Bilirubin 2.2 (*)    All other components within normal limits  URINALYSIS, ROUTINE W REFLEX MICROSCOPIC (NOT AT [facility]) - Abnormal; Notable for the following:    Color, Urine ORANGE (*)    APPearance CLOUDY (*)    Hgb urine dipstick LARGE (*)    Bilirubin Urine MODERATE (*)    Ketones, ur 40 (*)    Protein, ur 100 (*)    Leukocytes, UA SMALL (*)    All other components within normal limits  URINE MICROSCOPIC-ADD ON - Abnormal; Notable for the following:    Squamous Epithelial / LPF 0-5 (*)    Casts HYALINE CASTS (*)    All other components within normal limits  SYNOVIAL CELL COUNT + DIFF, W/ CRYSTALS - Abnormal; Notable for the following:    Appearance-Synovial CLOUDY (*)    WBC, Synovial [id number] (*)    Neutrophil, Synovial 96 (*)    Monocyte-Macrophage-Synovial Fluid 4 (*)    All other components within normal limits  GRAM STAIN  CULTURE, BLOOD (ROUTINE X 2)  CULTURE, BLOOD (ROUTINE X 2)  URINE CULTURE  ANAEROBIC CULTURE  BODY FLUID CULTURE  I-STAT TROPOININ, ED  I-STAT CG4 LACTIC ACID, ED  I-STAT CG4 LACTIC ACID, ED    Imaging Review Dg Chest 1 View  05/24/2015  CLINICAL DATA:  Gout attack. EXAM: CHEST 1 VIEW COMPARISON:  Oct 25, 2003. FINDINGS: Stable  cardiomediastinal silhouette. No pneumothorax or pleural effusion is noted. No acute pulmonary disease is noted. Bony thorax is intact. IMPRESSION: No acute cardiopulmonary abnormality seen. Electronically Signed   By: Lupita RaiderJames  Green Jr, M.D.   On: 05/24/2015 15:30   Dg Elbow Complete Right  05/24/2015  CLINICAL DATA:  79 year old male with right elbow pain and swelling radiating to the humerus. Patient reports a gout. Initial encounter. EXAM: RIGHT ELBOW - COMPLETE 3+ VIEW COMPARISON:  None. FINDINGS: Evidence of joint effusion. Generalized soft tissue swelling and stranding about the right elbow. Distal right humerus appears intact. There are small degenerative or posttraumatic ossific fragments about the medial epicondyles. Proximal radius and ulna appear intact. No subcutaneous gas. No radiopaque foreign body identified. IMPRESSION: Moderate to severe soft tissue swelling with evidence of joint effusion, but  no acute osseous abnormality identified. Electronically Signed   By: Odessa Fleming M.D.   On: 05/24/2015 15:27     I have personally reviewed and evaluated these images and lab results as part of my medical decision-making.   EKG Interpretation   Date/Time:  Wednesday May 24 2015 14:31:09 EST Ventricular Rate:  102 PR Interval:  176 QRS Duration: 96 QT Interval:  350 QTC Calculation: 456 R Axis:   -35 Text Interpretation:  Sinus tachycardia Multiple ventricular premature  complexes Probable left atrial enlargement Left axis deviation Borderline  T wave abnormalities Confirmed by COOK  MD, BRIAN ([id number]) on 05/24/2015  6:16:48 PM      MDM   Final diagnoses:  Generalized weakness  Acute gout of right elbow, unspecified cause  Fever, unspecified fever cause    79 year old male presents from urgent care with right arm swelling and generalized weakness. States his right arm pain feels consistent with his history of gout. Reports he has required assistance getting up from sitting  recently due to weakness. Denies fever, chills, chest pain, shortness of breath, abdominal pain, N/V, numbness, weakness, paresthesia, recent falls.   Patient febrile to 101. Heart rate 90s-100. No hypotension. Heart regular rhythm. Lungs clear to auscultation bilaterally. Abdomen soft, nontender, nondistended. Edema, erythema, heat, and decreased ROM to right elbow. Strength and sensation intact. Distal pulses intact.   Labs and imaging pending. Given 1L NS bolus.   EKG sinus tachycardia, heart rate 102, multiple PVCs. Troponin negative. Chest x-ray no acute cardiopulmonary abnormality. CBC negative for leukocytosis, hemoglobin 12.6. CMP remarkable for bilirubin 2.2. Lactic acid within normal limits. UA remarkable for small leukocytes, 6-30 WBC, no bacteria. Blood cultures and urine culture obtained.  Spoke with Dr. August Saucer (orthopedics) who will see the patient in the ED and advised to keep the patient NPO.  Aspiration of elbow performed by Dr. August Saucer. Gram stain remarkable for abundant WBC, predominantly PMNs, no organisms seen. Synovial cell count remarkable for 10,530 WBC, 96 neutrophils.  Spoke with Dr. August Saucer, who advised patient has gout and to treat medically.   Patient discussed with and seen by Dr. Adriana Simas. Given fever and significant erythema and heat on exam, will initiate vancomycin for possible cellulitis. Unassigned consulted for admission. Spoke with Dr. Toniann Fail, who will admit the patient for further evaluation and management.  BP 119/67 mmHg  Pulse 95  Temp(Src) 101 F (38.3 C) (Rectal)  Resp 13  SpO2 94%     Mady Gemma, PA-C 05/24/15 2103  Donnetta Hutching, MD 05/24/15 2314

## 2015-05-24 NOTE — ED Notes (Signed)
Per ems- pt comes from ucc c/o generalized weakness for 2 weeks and right elbow swelling for 3 days. Was told gout to right elbow/forearm. Pt is a x x 4. ST 101 PVC's, BP 142 palpated, 20 RR, 95% RA, CBG 129.

## 2015-05-24 NOTE — Progress Notes (Signed)
Trevor GhaziJesse R Bishop is a 79 y.o. male patient admitted from ED awake, alert - oriented  X 4 - no acute distress noted.  VSS - Blood pressure 157/74, pulse 102, temperature 98.5 F (36.9 C), temperature source Oral, resp. rate 18, height 6' (1.829 m), weight 87.9 kg (193 lb 12.6 oz), SpO2 98 %.    IV in place, occlusive dsg intact without redness.  Orientation to room, and floor completed with information packet given to patient/family.  Patient declined safety video at this time.  Admission INP armband ID verified with patient/family, and in place.   SR up x 2, fall assessment complete, with patient and family able to verbalize understanding of risk associated with falls, and verbalized understanding to call nsg before up out of bed.  Call light within reach, patient able to voice, and demonstrate understanding.  Skin, clean-dry- intact without evidence of bruising, or skin tears.   No evidence of skin break down noted on exam.     Will cont to eval and treat per MD orders.  Otis DialsCassandra J Zeynep Fantroy, RN 05/24/2015 10:59 PM

## 2015-05-24 NOTE — Progress Notes (Signed)
*  Preliminary Results* Right upper extremity venous duplex completed. Right upper extremity is negative for deep and superficial vein thrombosis.  05/24/2015 3:24 PM  Gertie FeyMichelle Jillene Wehrenberg, RVT, RDCS, RDMS

## 2015-05-24 NOTE — Progress Notes (Addendum)
ANTIBIOTIC CONSULT NOTE - INITIAL  Pharmacy Consult for Vancomycin and Rocephin Indication: cellulitis and UTI  No Known Allergies  Patient Measurements: Height: 6' (182.9 cm) Weight: 195 lb (88.451 kg) IBW/kg (Calculated) : 77.6  Vital Signs: Temp: 101 F (38.3 C) (12/14 1440) Temp Source: Rectal (12/14 1440) BP: 123/71 mmHg (12/14 2100) Pulse Rate: 97 (12/14 2100) Intake/Output from previous day:   Intake/Output from this shift:    Labs:  Recent Labs  05/24/15 1219 05/24/15 1500  WBC 6.0 5.3  HGB 13.4* 12.6*  PLT  --  131*  CREATININE  --  1.07   CrCl cannot be calculated (Unknown ideal weight.). No results for input(s): VANCOTROUGH, VANCOPEAK, VANCORANDOM, GENTTROUGH, GENTPEAK, GENTRANDOM, TOBRATROUGH, TOBRAPEAK, TOBRARND, AMIKACINPEAK, AMIKACINTROU, AMIKACIN in the last 72 hours.   Microbiology: Recent Results (from the past 720 hour(s))  Gram stain     Status: None   Collection Time: 05/24/15  5:45 PM  Result Value Ref Range Status   Specimen Description SYNOVIAL FLUID  Final   Special Requests LEFT ELBOW  Final   Gram Stain   Final    ABUNDANT WBC PRESENT, PREDOMINANTLY PMN NO ORGANISMS SEEN    Report Status 05/24/2015 FINAL  Final    Medical History: Past Medical History  Diagnosis Date  . Allergy   . Arthritis   . Stroke [facility](HCC)     Medications:   (Not in a hospital admission) Scheduled:   Infusions:  . vancomycin     Assessment: 79yo male with history of stroke presents with weakness and arm swelling. Pharmacy is consulted to dose vancomycin for suspected cellulitis. Tmax 101, WBC 5.3, sCr 1.07.  Pt received vancomycin 1g IV once in the ED.  Goal of Therapy:  Vancomycin trough level 10-15 mcg/ml  Plan:  Vancomycin 1g IV once followed by 750mg  IV q12h Measure antibiotic drug levels at steady state Follow up culture results, renal function and clinical course  Arlean HoppingCorey M. Newman PiesBall, PharmD, BCPS Clinical Pharmacist Pager  [phone] 05/24/2015,9:07 PM   Addendum:  Admitting MD adding Rocephin for UTI.   Plan: Rocephin 1gm IV q24h  Christoper Fabianaron Barrie Sigmund, PharmD, BCPS Clinical pharmacist, pager [phone] 05/25/2015 12:00 AM

## 2015-05-24 NOTE — ED Notes (Signed)
Patient transported to X-ray 

## 2015-05-24 NOTE — Progress Notes (Signed)
Right elbow aspirated lateral approach 10 mL of gout appearing fluid obtained We'll send for stat Gram stain cell count crystal analysis aerobic and anaerobic culture Nothing by mouth until crystal analysis returns

## 2015-05-25 DIAGNOSIS — M10021 Idiopathic gout, right elbow: Principal | ICD-10-CM

## 2015-05-25 DIAGNOSIS — R6510 Systemic inflammatory response syndrome (SIRS) of non-infectious origin without acute organ dysfunction: Secondary | ICD-10-CM

## 2015-05-25 DIAGNOSIS — R8290 Unspecified abnormal findings in urine: Secondary | ICD-10-CM

## 2015-05-25 LAB — BASIC METABOLIC PANEL
ANION GAP: 13 (ref 5–15)
BUN: 11 mg/dL (ref 6–20)
CALCIUM: 8.8 mg/dL — AB (ref 8.9–10.3)
CHLORIDE: 102 mmol/L (ref 101–111)
CO2: 26 mmol/L (ref 22–32)
CREATININE: 0.93 mg/dL (ref 0.61–1.24)
GFR calc non Af Amer: >60 mL/min (ref >60)
Glucose, Bld: 123 mg/dL — ABNORMAL HIGH (ref 65–99)
Potassium: 3.6 mmol/L (ref 3.5–5.1)
SODIUM: 141 mmol/L (ref 135–145)

## 2015-05-25 LAB — CBC
HCT: 40.4 % (ref 39.0–52.0)
Hemoglobin: 12.4 g/dL — ABNORMAL LOW (ref 13.0–17.0)
MCH: 26.4 pg (ref 26.0–34.0)
MCHC: 30.7 g/dL (ref 30.0–36.0)
MCV: 86.1 fL (ref 78.0–100.0)
PLATELETS: 151 10*3/uL (ref 150–400)
RBC: 4.69 MIL/uL (ref 4.22–5.81)
RDW: 15.6 % — ABNORMAL HIGH (ref 11.5–15.5)
WBC: 4.3 10*3/uL (ref 4.0–10.5)

## 2015-05-25 LAB — URINE CULTURE: Culture: NO GROWTH

## 2015-05-25 LAB — URIC ACID: URIC ACID, SERUM: 8.2 mg/dL — AB (ref 4.4–7.6)

## 2015-05-25 MED ORDER — DIPHENHYDRAMINE HCL 50 MG/ML IJ SOLN
25.0000 mg | Freq: Once | INTRAMUSCULAR | Status: DC
  Filled 2015-05-25: qty 1

## 2015-05-25 MED ORDER — PREDNISONE 20 MG PO TABS
40.0000 mg | ORAL_TABLET | Freq: Two times a day (BID) | ORAL | Status: DC
  Administered 2015-05-25 – 2015-05-26 (×3): 40 mg via ORAL
  Filled 2015-05-25 (×3): qty 2

## 2015-05-25 MED ORDER — DEXTROSE 5 % IV SOLN
1.0000 g | Freq: Every day | INTRAVENOUS | Status: DC
  Administered 2015-05-25 (×2): 1 g via INTRAVENOUS
  Filled 2015-05-25 (×3): qty 10

## 2015-05-25 MED ORDER — COLCHICINE 0.6 MG PO TABS
0.6000 mg | ORAL_TABLET | Freq: Every day | ORAL | Status: DC
  Administered 2015-05-25 – 2015-05-26 (×2): 0.6 mg via ORAL
  Filled 2015-05-25 (×2): qty 1

## 2015-05-25 NOTE — Progress Notes (Signed)
Pt c/o intense itching after administering Solumedrol. On-call MD paged. Will continue to monitor.

## 2015-05-25 NOTE — Progress Notes (Signed)
[facility] PROGRESS NOTE  Trevor Bishop UEA:[id number] DOB: March 03, 1936 DOA: 05/24/2015  PCP: No primary care provider on file.  Brief HPI: 79yo African-American male with a past medical history of gout who was prescribed allopurinol 2 months ago, but stopped taking it as his pain increased while taking this medication. He presented with complaints of pain and swelling in the right elbow area. He was hospitalized for further management.  Past medical history:  Past Medical History  Diagnosis Date  . Allergy   . Arthritis   . Stroke (HCC)   . Gout     Consultants: Orthopedics  Procedures: Arthrocentesis of the right elbow  Antibiotics: Ceftriaxone and vancomycin  Subjective: Patient feels slightly better this morning. Pain in the right elbow is improved. Still has significant discomfort and limited mobility. Denies any nausea, vomiting. No dysuria. No abdominal pain.  Objective: Vital Signs  Filed Vitals:   05/24/15 2218 05/24/15 2233 05/25/15 0549 05/25/15 1317  BP: 157/74  134/67 128/83  Pulse: 102  100 102  Temp: 98.5 F (36.9 C)  98.7 F (37.1 C) 98.4 F (36.9 C)  TempSrc: Oral  Oral Oral  Resp: Height:      Weight:  87.9 kg (193 lb 12.6 oz)    SpO2: 98%  97% 96%    Intake/Output Summary (Last 24 hours) at 05/25/15 1321 Last data filed at 05/25/15 1146  Gross per 24 hour  Intake 1298.33 ml  Output      0 ml  Net 1298.33 ml   Filed Weights   05/24/15 2115 05/24/15 2233  Weight: 88.451 kg (195 lb) 87.9 kg (193 lb 12.6 oz)    General appearance: alert, cooperative, appears stated age and no distress Cardio: regular rate and rhythm, S1, S2 normal, no murmur, click, rub or gallop GI: soft, non-tender; bowel sounds normal; no masses,  no organomegaly Extremities: Right elbow continues to have swelling. Limited range of motion. Warm to touch. Tender with movement. Neurologic: No focal deficits  Lab Results:  Basic Metabolic  Panel:  Recent Labs Lab 05/24/15 1500 05/25/15 0525  NA 138 141  K 3.5 3.6  CL 99* 102  CO2 29 26  GLUCOSE 108* 123*  BUN 14 11  CREATININE 1.07 0.93  CALCIUM 8.9 8.8*   Liver Function Tests:  Recent Labs Lab 05/24/15 1500  AST 57*  ALT 27  ALKPHOS 59  BILITOT 2.2*  PROT 7.1  ALBUMIN 3.5   CBC:  Recent Labs Lab 05/24/15 1219 05/24/15 1500 05/25/15 0525  WBC 6.0 5.3 4.3  NEUTROABS  --  3.8  --   HGB 13.4* 12.6* 12.4*  HCT 38.9* 39.7 40.4  MCV 80.4 84.6 86.1  PLT  --  131* 151     Recent Results (from the past 240 hour(s))  Urine culture     Status: None (Preliminary result)   Collection Time: 05/24/15  4:45 PM  Result Value Ref Range Status   Specimen Description URINE, RANDOM  Final   Special Requests NONE  Final   Culture NO GROWTH < 24 HOURS  Final   Report Status PENDING  Incomplete  Gram stain     Status: None   Collection Time: 05/24/15  5:45 PM  Result Value Ref Range Status   Specimen Description SYNOVIAL FLUID  Final   Special Requests RIGHT  ELBOW  Final   Gram Stain   Final    ABUNDANT WBC PRESENT, PREDOMINANTLY PMN NO ORGANISMS SEEN  Report Status 05/24/2015 FINAL  Final      Studies/Results: Dg Chest 1 View  05/24/2015  CLINICAL DATA:  Gout attack. EXAM: CHEST 1 VIEW COMPARISON:  Oct 25, 2003. FINDINGS: Stable cardiomediastinal silhouette. No pneumothorax or pleural effusion is noted. No acute pulmonary disease is noted. Bony thorax is intact. IMPRESSION: No acute cardiopulmonary abnormality seen. Electronically Signed   By: Lupita RaiderJames  Green Jr, M.D.   On: 05/24/2015 15:30   Dg Elbow Complete Right  05/24/2015  CLINICAL DATA:  79 year old male with right elbow pain and swelling radiating to the humerus. Patient reports a gout. Initial encounter. EXAM: RIGHT ELBOW - COMPLETE 3+ VIEW COMPARISON:  None. FINDINGS: Evidence of joint effusion. Generalized soft tissue swelling and stranding about the right elbow. Distal right humerus appears  intact. There are small degenerative or posttraumatic ossific fragments about the medial epicondyles. Proximal radius and ulna appear intact. No subcutaneous gas. No radiopaque foreign body identified. IMPRESSION: Moderate to severe soft tissue swelling with evidence of joint effusion, but no acute osseous abnormality identified. Electronically Signed   By: Odessa FlemingH  Hall M.D.   On: 05/24/2015 15:27    Medications:  Scheduled: . aspirin EC  162 mg Oral Daily  . cefTRIAXone (ROCEPHIN)  IV  1 g Intravenous QHS  . colchicine  0.6 mg Oral Daily  . diphenhydrAMINE  25 mg Intravenous Once  . enoxaparin (LOVENOX) injection  40 mg Subcutaneous Daily  . predniSONE  40 mg Oral BID WC  . vancomycin  750 mg Intravenous Q12H   Continuous: . sodium chloride 75 mL/hr at 05/25/15 1219   ZOX:[id number]:acetaminophen **OR** acetaminophen, morphine injection, ondansetron **OR** ondansetron (ZOFRAN) IV  Assessment/Plan:  Principal Problem:   SIRS (systemic inflammatory response syndrome) (HCC) Active Problems:   Fever   Gout attack   Acute gout    Acute gout with SIRS His presentation and synovitis fluid analysis is highly suggestive of acute gouty arthritis. Gram stain was negative for organism. Cell count was more suggestive of inflammation rather than infection. Continue with steroids. Add colchicine. Uric acid is noted to be elevated. He needs to be placed back on allopurinol once he is over his acute episode. Being on colchicine will prevent acute exacerbation that can occur while taking allopurinol. This is likely what happened when he initially took it. PT and OT to evaluate.  Abnormal UA Symptoms not suggestive of UTI. UA does not conclusive for infection. Monitor for now. Await urine cultures. Continue antibiotics for now.  DVT Prophylaxis: Lovenox    Code Status: Full code  Family Communication: Discussed with the patient  Disposition Plan: Continue current management. PT and OT to evaluate. Anticipate  Discharge tomorrow.    LOS: 1 day   [facility] CareKRISHNAN,Zyair Russi  [facility] Pager [phone] 05/25/2015, 1:21 PM  If 7PM-7AM, please contact night-coverage at [email or web address], password [email or web address]

## 2015-05-25 NOTE — Care Management Note (Signed)
Case Management Note  Patient Details  Name: Trevor GhaziJesse R Helser MRN: [id number] Date of Birth: June 29, 1935  Subjective/Objective:                  Date-05-25-15 Initial Assessment Spoke with patient at the bedside.  Introduced self as Sports coachcase manager and explained role in discharge planning and how to be reached.  [place].   Verified patient anticipates to go home with spouse, at time of discharge and will have part-time supervision by wife at this time to best of their knowledge.  Patient has DME walker. Expressed potential need for no other DME.  Patient denied needing help with their medication.  Patient drove prior to admission and anticipates wife will drive once he is discharged.  Verified patient has PCP Dr Renato Gailseed. Patient states they currently receive HH services through no one.   Plan: CM will continue to follow for discharge planning and [facility].   Lawerance Sabalebbie Saryah Loper RN BSN CM [phone]   Action/Plan:  Patient admitted with gout flare. States he does not like the way his meds make him feel, doesn't always take them as prescribed, but he can afford them. Receiving  IV Abx, PT OT eval pending.  Expected Discharge Date:                  Expected Discharge Plan:  Home w Home Health Services  In-House Referral:     Discharge planning Services  CM Consult  Post Acute Care Choice:    Choice offered to:     DME Arranged:    DME Agency:     HH Arranged:    HH Agency:     Status of Service:  In process, will continue to follow  Medicare Important Message Given:    Date Medicare IM Given:    Medicare IM give by:    Date Additional Medicare IM Given:    Additional Medicare Important Message give by:     If discussed at Long Length of Stay Meetings, dates discussed:    Additional Comments:  Lawerance SabalDebbie Windie Marasco, RN 05/25/2015, 11:37 AM

## 2015-05-25 NOTE — Evaluation (Signed)
Physical Therapy Evaluation Patient Details Name: Trevor GhaziJesse R Mcnay MRN: [id number] DOB: 07-27-1935 Today's Date: 05/25/2015   History of Present Illness  Patient is a 79 y.o. male admitted with R elbow pain and progressive swelling with h/o gout.  In ED found to be febrile and tachycardic.  Aspiration of elbow negative for organisms, but pt positive for UTI.  Clinical Impression  Patient presents with decreased independence with mobility due to stiffness in knees and painful R elbow as well as deficits listed in problem list.  He will benefit from skilled PT in the acute setting to allow return home with family support and likely not to need follow up PT depending on progress.    Follow Up Recommendations No PT follow up    Equipment Recommendations  None recommended by PT    Recommendations for Other Services       Precautions / Restrictions Precautions Precautions: Fall      Mobility  Bed Mobility Overal bed mobility: Needs Assistance Bed Mobility: Supine to Sit     Supine to sit: Mod assist     General bed mobility comments: assist to lift trunk and scoot hips to EOB due to R UE pain with pushing/pulling  Transfers Overall transfer level: Needs assistance Equipment used: Rolling walker (2 wheeled) Transfers: Sit to/from Stand Sit to Stand: Min assist         General transfer comment: cues for hand placement; increased time and UE support needed for stand to sit due to stiffness and arthritic pain in knees  Ambulation/Gait Ambulation/Gait assistance: Supervision;Min guard Ambulation Distance (Feet): 300 Feet Assistive device: Rolling walker (2 wheeled) Gait Pattern/deviations: Step-through pattern;Decreased stride length     General Gait Details: slow but steady, walked some in room no device minguard for safety  Stairs            Wheelchair Mobility    Modified Rankin (Stroke Patients Only)       Balance Overall balance assessment: Needs  assistance         Standing balance support: No upper extremity supported;During functional activity Standing balance-Leahy Scale: Fair Standing balance comment: standing for hygiene after toileting without UE support, washed hands without UE support                             Pertinent Vitals/Pain Pain Assessment: Faces Faces Pain Scale: Hurts little more Pain Location: with use of R arm Pain Intervention(s): Monitored during session;Repositioned    Home Living Family/patient expects to be discharged to:: Private residence Living Arrangements: Spouse/significant other Available Help at Discharge: Family Type of Home: House Home Access: Stairs to enter   Enter[employer] CorporationEntrance Stairs-Number of Steps: 1 at back Home Layout: One level Home Equipment: Environmental consultantWalker - 2 wheels;Cane - single point      Prior Function Level of Independence: Independent         Comments: normally not using device, used cane just prior to coming to hospital     Hand Dominance        Extremity/Trunk Assessment               Lower Extremity Assessment: Overall WFL for tasks assessed      Cervical / Trunk Assessment: Kyphotic  Communication   Communication: No difficulties  Cognition Arousal/Alertness: Awake/alert Behavior During Therapy: WFL for tasks assessed/performed Overall Cognitive Status: Within Functional Limits for tasks assessed  General Comments      Exercises General Exercises - Lower Extremity Heel Slides: AROM;Both;5 reps;Supine      Assessment/Plan    PT Assessment Patient needs continued PT services  PT Diagnosis Abnormality of gait   PT Problem List Decreased range of motion;Decreased balance;Decreased mobility;Pain  PT Treatment Interventions DME instruction;Gait training;Balance training;Functional mobility training;Patient/family education;Therapeutic activities;Therapeutic exercise   PT Goals (Current goals can be found in  the Care Plan section) Acute Rehab PT Goals Patient Stated Goal: To return to independent PT Goal Formulation: With patient Time For Goal Achievement: 06/01/15 Potential to Achieve Goals: Good    Frequency Min 3X/week   Barriers to discharge        Co-evaluation               End of Session Equipment Utilized During Treatment: Gait belt Activity Tolerance: Patient tolerated treatment well Patient left: in bed;with call bell/phone within reach;with bed alarm set           Time: 6045-4098 PT Time Calculation (min) (ACUTE ONLY): 27 min   Charges:   PT Evaluation $Initial PT Evaluation Tier I: 1 Procedure PT Treatments $Gait Training: 8-22 mins   PT G CodesElray Mcgregor May 28, 2015, 5:07 PM Sheran Lawless, PT [phone] 2015/05/28

## 2015-05-26 MED ORDER — PREDNISONE 20 MG PO TABS: ORAL_TABLET | ORAL | Status: DC

## 2015-05-26 MED ORDER — COLCHICINE 0.6 MG PO TABS: 0.6000 mg | ORAL_TABLET | Freq: Every day | ORAL | Status: DC

## 2015-05-26 NOTE — Care Management Note (Signed)
Case Management Note  Patient Details  Name: Gwenevere GhaziJesse R Rupard MRN: [id number] Date of Birth: March 13, 1936  Subjective/Objective:   Patient is for dc today, he will need a rolling walker, [facility], he will bring rolling walker to patient's room.  Per pt no pt follow up needed.                Action/Plan:   Expected Discharge Date:                  Expected Discharge Plan:  Home w Home Health Services  In-House Referral:     Discharge planning Services  CM Consult  Post Acute Care Choice:    Choice offered to:     DME Arranged:  Walker rolling DME Agency:  [facility]  HH Arranged:    HH Agency:     Status of Service:  Completed, signed off  Medicare Important Message Given:    Date Medicare IM Given:    Medicare IM give by:    Date Additional Medicare IM Given:    Additional Medicare Important Message give by:     If discussed at Long Length of Stay Meetings, dates discussed:    Additional Comments:  Leone Havenaylor, Dajah Fischman [place], RN 05/26/2015, 10:53 AM

## 2015-05-26 NOTE — Progress Notes (Addendum)
Patient made RN aware that "I stopped taking his gout medications because I could not afford them". Case Manager made aware for medication needs.   1:43 PM  CM consulted with patient and patient denied need

## 2015-05-26 NOTE — Progress Notes (Signed)
OT Cancellation Note  Patient Details Name: Gwenevere GhaziJesse R Aylesworth MRN: [id number] DOB: 30-Dec-1935   Cancelled Treatment:    Reason Eval/Treat Not Completed: OT screened, no needs identified, will sign off. Pt received RW from PT and reports just waiting for RN to d/c home.  Rosalio LoudHOXIE, Alonso Gapinski 05/26/2015, 1:57 PM

## 2015-05-26 NOTE — Discharge Instructions (Signed)

## 2015-05-26 NOTE — Progress Notes (Signed)
Trevor Bishop to be D/Bishop'd Home per MD order.  Discussed with the patient and all questions fully answered.  VSS, IV catheter discontinued intact. Site without signs and symptoms of complications. Dressing and pressure applied.  An After Visit Summary was printed and given to the patient. Patient received prescription.  D/Bishop education completed with patient/family including follow up instructions, medication list, d/Bishop activities limitations if indicated, with other d/Bishop instructions as indicated by MD - patient able to verbalize understanding, all questions fully answered.   Patient instructed to return to ED, call 911, or call MD for any changes in condition.   Patient escorted via WC, and D/Bishop home via private auto.  [name], Trevor Bishop 05/26/2015 2:07 PM

## 2015-05-26 NOTE — Progress Notes (Signed)
Family updated on patient's discharge and that we are waiting for a walker to be delivered to room. Daughter stated someone will be there to pick up patient.

## 2015-05-26 NOTE — Discharge Summary (Signed)
[facility]  Physician Discharge Summary   Patient ID: Trevor Bishop MRN: [id number] DOB/AGE: 10/08/1935 79 y.o.  Admit date: 05/24/2015 Discharge date: 05/26/2015  PCP: Lolita Patella, MD  DISCHARGE DIAGNOSES:  Principal Problem:   SIRS (systemic inflammatory response syndrome) (HCC) Active Problems:   Fever   Gout attack   Acute gout   RECOMMENDATIONS FOR OUTPATIENT FOLLOW UP: 1. Patient to discuss with his PCP regarding definitive management of gout. Please see below.  DISCHARGE CONDITION: fair  Diet recommendation: Low sodium  Filed Weights   05/24/15 2115 05/24/15 2233  Weight: 88.451 kg (195 lb) 87.9 kg (193 lb 12.6 oz)    INITIAL HISTORY: 79yo African-American male with a past medical history of gout who was prescribed allopurinol 2 months ago, but stopped taking it as his pain increased while taking this medication. He presented with complaints of pain and swelling in the right elbow area. He was hospitalized for further management.  Consultations:  Orthopedics, Dr. August Saucer  Procedures:  Arthrocentesis of the right elbow  HOSPITAL COURSE:   Acute gout with SIRS His presentation and synovitis fluid analysis was highly suggestive of acute gouty arthritis. Gram stain was negative for organism. Cell count was more suggestive of inflammation rather than infection. Patient was started on steroids. Colchicine was added. Patient started improving. He feels much of this morning. Cultures remained negative. This is all secondary to gout. Uric acid is noted to be elevated. He needs to be placed back on allopurinol once he is over his acute episode. Being on colchicine will prevent acute exacerbation that can occur while taking allopurinol. This is likely what happened when he initially took it. Patient was seen by therapy and does not have any home needs. He is requesting a walker, which will be ordered..  Abnormal UA Symptoms not suggestive of UTI. UA  does not conclusive for infection. Urine culture is also negative. No need for antibiotics.   Overall improved. Okay for discharge home today.  PERTINENT LABS:  The results of significant diagnostics from this hospitalization (including imaging, microbiology, ancillary and laboratory) are listed below for reference.    Microbiology: Recent Results (from the past 240 hour(s))  Blood Culture (routine x 2)     Status: None (Preliminary result)   Collection Time: 05/24/15  3:00 PM  Result Value Ref Range Status   Specimen Description BLOOD LEFT HAND  Final   Special Requests BOTTLES DRAWN AEROBIC AND ANAEROBIC 5CC  Final   Culture NO GROWTH 2 DAYS  Final   Report Status PENDING  Incomplete  Blood Culture (routine x 2)     Status: None (Preliminary result)   Collection Time: 05/24/15  3:50 PM  Result Value Ref Range Status   Specimen Description BLOOD LEFT HAND  Final   Special Requests BOTTLES DRAWN AEROBIC AND ANAEROBIC 10CC  Final   Culture NO GROWTH 2 DAYS  Final   Report Status PENDING  Incomplete  Urine culture     Status: None   Collection Time: 05/24/15  4:45 PM  Result Value Ref Range Status   Specimen Description URINE, RANDOM  Final   Special Requests NONE  Final   Culture NO GROWTH 1 DAY  Final   Report Status 05/25/2015 FINAL  Final  Gram stain     Status: None   Collection Time: 05/24/15  5:45 PM  Result Value Ref Range Status   Specimen Description SYNOVIAL FLUID  Final   Special Requests RIGHT  ELBOW  Final  Gram Stain   Final    ABUNDANT WBC PRESENT, PREDOMINANTLY PMN NO ORGANISMS SEEN    Report Status 05/24/2015 FINAL  Final  Culture, body fluid-bottle     Status: None (Preliminary result)   Collection Time: 05/24/15  5:45 PM  Result Value Ref Range Status   Specimen Description FLUID SYNOVIAL  Final   Special Requests RIGHT ELBOW  Final   Culture NO GROWTH 2 DAYS  Final   Report Status PENDING  Incomplete     Labs: Basic Metabolic Panel:  Recent  Labs Lab 05/24/15 1500 05/25/15 0525  NA 138 141  K 3.5 3.6  CL 99* 102  CO2 29 26  GLUCOSE 108* 123*  BUN 14 11  CREATININE 1.07 0.93  CALCIUM 8.9 8.8*   Liver Function Tests:  Recent Labs Lab 05/24/15 1500  AST 57*  ALT 27  ALKPHOS 59  BILITOT 2.2*  PROT 7.1  ALBUMIN 3.5   CBC:  Recent Labs Lab 05/24/15 1219 05/24/15 1500 05/25/15 0525  WBC 6.0 5.3 4.3  NEUTROABS  --  3.8  --   HGB 13.4* 12.6* 12.4*  HCT 38.9* 39.7 40.4  MCV 80.4 84.6 86.1  PLT  --  131* 151    IMAGING STUDIES Dg Chest 1 View  05/24/2015  CLINICAL DATA:  Gout attack. EXAM: CHEST 1 VIEW COMPARISON:  Oct 25, 2003. FINDINGS: Stable cardiomediastinal silhouette. No pneumothorax or pleural effusion is noted. No acute pulmonary disease is noted. Bony thorax is intact. IMPRESSION: No acute cardiopulmonary abnormality seen. Electronically Signed   By: Lupita Raider, M.D.   On: 05/24/2015 15:30   Dg Elbow Complete Right  05/24/2015  CLINICAL DATA:  79 year old male with right elbow pain and swelling radiating to the humerus. Patient reports a gout. Initial encounter. EXAM: RIGHT ELBOW - COMPLETE 3+ VIEW COMPARISON:  None. FINDINGS: Evidence of joint effusion. Generalized soft tissue swelling and stranding about the right elbow. Distal right humerus appears intact. There are small degenerative or posttraumatic ossific fragments about the medial epicondyles. Proximal radius and ulna appear intact. No subcutaneous gas. No radiopaque foreign body identified. IMPRESSION: Moderate to severe soft tissue swelling with evidence of joint effusion, but no acute osseous abnormality identified. Electronically Signed   By: Odessa Fleming M.D.   On: 05/24/2015 15:27    DISCHARGE EXAMINATION: Filed Vitals:   05/25/15 1855 05/25/15 2048 05/26/15 0606 05/26/15 1314  BP:  130/58 132/49 155/70  Pulse: 91 81 67 81  Temp:  97.8 F (36.6 C) 97.8 F (36.6 C) 98.5 F (36.9 C)  TempSrc:  Oral Oral Oral  Resp:  18 18 16     Height:      Weight:      SpO2:  98% 99% 98%   General appearance: alert, cooperative, appears stated age and no distress Resp: clear to auscultation bilaterally Cardio: regular rate and rhythm, S1, S2 normal, no murmur, click, rub or gallop GI: soft, non-tender; bowel sounds normal; no masses,  no organomegaly Extremities: Right elbow swelling has significantly improved. Improved range of motion. Less warm to touch.   DISPOSITION: Home  Discharge Instructions    Call MD for:  difficulty breathing, headache or visual disturbances    Complete by:  As directed      Call MD for:  extreme fatigue    Complete by:  As directed      Call MD for:  persistant dizziness or light-headedness    Complete by:  As directed  Call MD for:  persistant nausea and vomiting    Complete by:  As directed      Call MD for:  redness, tenderness, or signs of infection (pain, swelling, redness, odor or green/yellow discharge around incision site)    Complete by:  As directed      Call MD for:  severe uncontrolled pain    Complete by:  As directed      Call MD for:  temperature >100.4    Complete by:  As directed      Diet - low sodium heart healthy    Complete by:  As directed      Discharge instructions    Complete by:  As directed   Please talk to your PCP about getting back on the Allopurinol or an alternative medication for Gout when your elbow is back to normal.   You were cared for by a hospitalist during your hospital stay. If you have any questions about your discharge medications or the care you received while you were in the hospital after you are discharged, you can call the unit and asked to speak with the hospitalist on call if the hospitalist that took care of you is not available. Once you are discharged, your primary care physician will handle any further medical issues. Please note that NO REFILLS for any discharge medications will be authorized once you are discharged, as it is  imperative that you return to your primary care physician (or establish a relationship with a primary care physician if you do not have one) for your aftercare needs so that they can reassess your need for medications and monitor your lab values. If you do not have a primary care physician, you can call [phone] for a physician referral.     Increase activity slowly    Complete by:  As directed            ALLERGIES: No Known Allergies   Current Discharge Medication List    START taking these medications   Details  colchicine 0.6 MG tablet Take 1 tablet (0.6 mg total) by mouth daily. Qty: 30 tablet, Refills: 1    predniSONE (DELTASONE) 20 MG tablet Take 2 tablets twice daily for 3 days, then take 2 tablets once daily for 3 days, then take 1 tablet once daily for 3 days, then STOP. Qty: 27 tablet, Refills: 0      CONTINUE these medications which have NOT CHANGED   Details  aspirin EC 81 MG tablet Take 162 mg by mouth daily.    Menthol, Topical Analgesic, (ICY HOT POWER) 16 % GEL Apply 1 application topically daily as needed (general pain / arm).    naproxen sodium (ANAPROX) 220 MG tablet Take 440 mg by mouth daily as needed (general pain).    OVER THE COUNTER MEDICATION Take 1 tablet by mouth daily. "Prostratus"       Follow-up Information    Follow up with Lolita Patella, MD. Schedule an appointment as soon as possible for a visit in 1 week.   Specialty:  Family Medicine   Why:  post hospitalization follow up and to discuss reinitiation of allopurinol.   Contact information:   [address] [place] [address] [phone]       Follow up with [facility]   Why:  rolling walker, will be brought up to patient's room   Contact information:   [address] [place] [address] [phone]  TOTAL DISCHARGE TIME: 35 minutes  [facility]ISHNAN,Quaniyah Bugh  [facility] Pager [phone]  05/26/2015, 1:57 PM

## 2015-05-29 LAB — CULTURE, BLOOD (ROUTINE X 2)
CULTURE: NO GROWTH
Culture: NO GROWTH

## 2015-05-29 LAB — CULTURE, BODY FLUID-BOTTLE

## 2015-05-29 LAB — CULTURE, BODY FLUID W GRAM STAIN -BOTTLE: Culture: NO GROWTH

## 2016-01-29 ENCOUNTER — Emergency Department (HOSPITAL_COMMUNITY): Payer: Commercial Managed Care - HMO

## 2016-01-29 ENCOUNTER — Encounter (HOSPITAL_COMMUNITY): Payer: Self-pay

## 2016-01-29 DIAGNOSIS — E785 Hyperlipidemia, unspecified: Secondary | ICD-10-CM | POA: Diagnosis present

## 2016-01-29 DIAGNOSIS — D61818 Other pancytopenia: Secondary | ICD-10-CM | POA: Diagnosis present

## 2016-01-29 DIAGNOSIS — D696 Thrombocytopenia, unspecified: Secondary | ICD-10-CM | POA: Diagnosis present

## 2016-01-29 DIAGNOSIS — M1990 Unspecified osteoarthritis, unspecified site: Secondary | ICD-10-CM | POA: Diagnosis present

## 2016-01-29 DIAGNOSIS — Z79899 Other long term (current) drug therapy: Secondary | ICD-10-CM

## 2016-01-29 DIAGNOSIS — R4781 Slurred speech: Secondary | ICD-10-CM | POA: Diagnosis present

## 2016-01-29 DIAGNOSIS — I10 Essential (primary) hypertension: Secondary | ICD-10-CM | POA: Diagnosis present

## 2016-01-29 DIAGNOSIS — R42 Dizziness and giddiness: Secondary | ICD-10-CM | POA: Diagnosis not present

## 2016-01-29 DIAGNOSIS — I651 Occlusion and stenosis of basilar artery: Secondary | ICD-10-CM | POA: Diagnosis present

## 2016-01-29 DIAGNOSIS — I639 Cerebral infarction, unspecified: Principal | ICD-10-CM | POA: Diagnosis present

## 2016-01-29 DIAGNOSIS — G8191 Hemiplegia, unspecified affecting right dominant side: Secondary | ICD-10-CM | POA: Diagnosis present

## 2016-01-29 DIAGNOSIS — Z8673 Personal history of transient ischemic attack (TIA), and cerebral infarction without residual deficits: Secondary | ICD-10-CM

## 2016-01-29 DIAGNOSIS — M109 Gout, unspecified: Secondary | ICD-10-CM | POA: Diagnosis present

## 2016-01-29 LAB — COMPREHENSIVE METABOLIC PANEL
ALT: 32 U/L (ref 17–63)
ANION GAP: 5 (ref 5–15)
AST: 37 U/L (ref 15–41)
Albumin: 4 g/dL (ref 3.5–5.0)
Alkaline Phosphatase: 63 U/L (ref 38–126)
BUN: 8 mg/dL (ref 6–20)
CHLORIDE: 109 mmol/L (ref 101–111)
CO2: 27 mmol/L (ref 22–32)
CREATININE: 0.94 mg/dL (ref 0.61–1.24)
Calcium: 9.2 mg/dL (ref 8.9–10.3)
Glucose, Bld: 144 mg/dL — ABNORMAL HIGH (ref 65–99)
POTASSIUM: 3.6 mmol/L (ref 3.5–5.1)
SODIUM: 141 mmol/L (ref 135–145)
Total Bilirubin: 0.5 mg/dL (ref 0.3–1.2)
Total Protein: 6.9 g/dL (ref 6.5–8.1)

## 2016-01-29 LAB — I-STAT CHEM 8, ED
BUN: 8 mg/dL (ref 6–20)
CHLORIDE: 106 mmol/L (ref 101–111)
Calcium, Ion: 1.13 mmol/L (ref 1.12–1.23)
Creatinine, Ser: 0.9 mg/dL (ref 0.61–1.24)
Glucose, Bld: 142 mg/dL — ABNORMAL HIGH (ref 65–99)
HEMATOCRIT: 39 % (ref 39.0–52.0)
HEMOGLOBIN: 13.3 g/dL (ref 13.0–17.0)
POTASSIUM: 3.6 mmol/L (ref 3.5–5.1)
SODIUM: 143 mmol/L (ref 135–145)
TCO2: 27 mmol/L (ref 0–100)

## 2016-01-29 LAB — ETHANOL

## 2016-01-29 LAB — DIFFERENTIAL
BASOS ABS: 0 10*3/uL (ref 0.0–0.1)
BASOS PCT: 0 %
EOS ABS: 0 10*3/uL (ref 0.0–0.7)
Eosinophils Relative: 0 %
Lymphocytes Relative: 29 %
Lymphs Abs: 0.9 10*3/uL (ref 0.7–4.0)
MONO ABS: 0.3 10*3/uL (ref 0.1–1.0)
Monocytes Relative: 8 %
NEUTROS ABS: 1.9 10*3/uL (ref 1.7–7.7)
Neutrophils Relative %: 63 %

## 2016-01-29 LAB — I-STAT TROPONIN, ED: TROPONIN I, POC: 0 ng/mL (ref 0.00–0.08)

## 2016-01-29 LAB — APTT: aPTT: 31 seconds (ref 24–36)

## 2016-01-29 LAB — CBC
HEMATOCRIT: 40.7 % (ref 39.0–52.0)
Hemoglobin: 12.4 g/dL — ABNORMAL LOW (ref 13.0–17.0)
MCH: 25.9 pg — ABNORMAL LOW (ref 26.0–34.0)
MCHC: 30.5 g/dL (ref 30.0–36.0)
MCV: 85 fL (ref 78.0–100.0)
PLATELETS: 135 10*3/uL — AB (ref 150–400)
RBC: 4.79 MIL/uL (ref 4.22–5.81)
RDW: 15.2 % (ref 11.5–15.5)
WBC: 3.1 10*3/uL — AB (ref 4.0–10.5)

## 2016-01-29 LAB — PROTIME-INR
INR: 1.13
Prothrombin Time: 14.5 seconds (ref 11.4–15.2)

## 2016-01-29 NOTE — ED Triage Notes (Signed)
Pt complaining of lightheadedness and dizziness. Pt states intermittent R arm numbness since this 8 am today. Pt a/o x 4, no other neuro deficits.

## 2016-01-30 ENCOUNTER — Inpatient Hospital Stay (HOSPITAL_COMMUNITY)
Admission: EM | Admit: 2016-01-30 | Discharge: 2016-02-01 | DRG: 065 | Disposition: A | Discharge status: Home Health | Payer: Commercial Managed Care - HMO | Attending: Internal Medicine | Admitting: Internal Medicine

## 2016-01-30 ENCOUNTER — Emergency Department (HOSPITAL_COMMUNITY): Payer: Commercial Managed Care - HMO

## 2016-01-30 ENCOUNTER — Observation Stay (HOSPITAL_BASED_OUTPATIENT_CLINIC_OR_DEPARTMENT_OTHER): Payer: Commercial Managed Care - HMO

## 2016-01-30 ENCOUNTER — Encounter (HOSPITAL_COMMUNITY): Payer: Self-pay | Admitting: Internal Medicine

## 2016-01-30 ENCOUNTER — Observation Stay (HOSPITAL_COMMUNITY): Payer: Commercial Managed Care - HMO

## 2016-01-30 DIAGNOSIS — R4781 Slurred speech: Secondary | ICD-10-CM | POA: Diagnosis present

## 2016-01-30 DIAGNOSIS — I10 Essential (primary) hypertension: Secondary | ICD-10-CM | POA: Diagnosis present

## 2016-01-30 DIAGNOSIS — G458 Other transient cerebral ischemic attacks and related syndromes: Secondary | ICD-10-CM

## 2016-01-30 DIAGNOSIS — R42 Dizziness and giddiness: Secondary | ICD-10-CM | POA: Diagnosis present

## 2016-01-30 DIAGNOSIS — M109 Gout, unspecified: Secondary | ICD-10-CM | POA: Diagnosis present

## 2016-01-30 DIAGNOSIS — E785 Hyperlipidemia, unspecified: Secondary | ICD-10-CM | POA: Diagnosis present

## 2016-01-30 DIAGNOSIS — I639 Cerebral infarction, unspecified: Secondary | ICD-10-CM | POA: Diagnosis present

## 2016-01-30 DIAGNOSIS — D61818 Other pancytopenia: Secondary | ICD-10-CM | POA: Diagnosis present

## 2016-01-30 DIAGNOSIS — I6350 Cerebral infarction due to unspecified occlusion or stenosis of unspecified cerebral artery: Secondary | ICD-10-CM | POA: Diagnosis not present

## 2016-01-30 DIAGNOSIS — Z8673 Personal history of transient ischemic attack (TIA), and cerebral infarction without residual deficits: Secondary | ICD-10-CM | POA: Diagnosis not present

## 2016-01-30 DIAGNOSIS — Z79899 Other long term (current) drug therapy: Secondary | ICD-10-CM | POA: Diagnosis not present

## 2016-01-30 DIAGNOSIS — I6789 Other cerebrovascular disease: Secondary | ICD-10-CM | POA: Diagnosis not present

## 2016-01-30 DIAGNOSIS — G8191 Hemiplegia, unspecified affecting right dominant side: Secondary | ICD-10-CM | POA: Diagnosis present

## 2016-01-30 DIAGNOSIS — M1990 Unspecified osteoarthritis, unspecified site: Secondary | ICD-10-CM | POA: Diagnosis present

## 2016-01-30 DIAGNOSIS — I651 Occlusion and stenosis of basilar artery: Secondary | ICD-10-CM | POA: Diagnosis present

## 2016-01-30 DIAGNOSIS — G459 Transient cerebral ischemic attack, unspecified: Secondary | ICD-10-CM

## 2016-01-30 DIAGNOSIS — D696 Thrombocytopenia, unspecified: Secondary | ICD-10-CM | POA: Diagnosis present

## 2016-01-30 LAB — LIPID PANEL
CHOL/HDL RATIO: 3.2 ratio
CHOLESTEROL: 102 mg/dL (ref 0–200)
HDL: 32 mg/dL — ABNORMAL LOW (ref >40)
LDL Cholesterol: 57 mg/dL (ref 0–99)
TRIGLYCERIDES: 66 mg/dL (ref <150)
VLDL: 13 mg/dL (ref 0–40)

## 2016-01-30 LAB — COMPREHENSIVE METABOLIC PANEL
ALBUMIN: 3.7 g/dL (ref 3.5–5.0)
ALT: 27 U/L (ref 17–63)
AST: 25 U/L (ref 15–41)
Alkaline Phosphatase: 55 U/L (ref 38–126)
Anion gap: 7 (ref 5–15)
BILIRUBIN TOTAL: 0.5 mg/dL (ref 0.3–1.2)
BUN: 8 mg/dL (ref 6–20)
CHLORIDE: 107 mmol/L (ref 101–111)
CO2: 28 mmol/L (ref 22–32)
Calcium: 9.1 mg/dL (ref 8.9–10.3)
Creatinine, Ser: 0.93 mg/dL (ref 0.61–1.24)
GFR calc Af Amer: >60 mL/min (ref >60)
GFR calc non Af Amer: >60 mL/min (ref >60)
GLUCOSE: 123 mg/dL — AB (ref 65–99)
POTASSIUM: 3.6 mmol/L (ref 3.5–5.1)
Sodium: 142 mmol/L (ref 135–145)
Total Protein: 6.4 g/dL — ABNORMAL LOW (ref 6.5–8.1)

## 2016-01-30 LAB — CBC
HEMATOCRIT: 41.3 % (ref 39.0–52.0)
Hemoglobin: 12.5 g/dL — ABNORMAL LOW (ref 13.0–17.0)
MCH: 25.8 pg — AB (ref 26.0–34.0)
MCHC: 30.3 g/dL (ref 30.0–36.0)
MCV: 85.2 fL (ref 78.0–100.0)
Platelets: 121 10*3/uL — ABNORMAL LOW (ref 150–400)
RBC: 4.85 MIL/uL (ref 4.22–5.81)
RDW: 15.4 % (ref 11.5–15.5)
WBC: 2.6 10*3/uL — ABNORMAL LOW (ref 4.0–10.5)

## 2016-01-30 LAB — ECHOCARDIOGRAM COMPLETE
HEIGHTINCHES: 73 in
Weight: 3036.8 oz

## 2016-01-30 MED ORDER — OMEGA-3-ACID ETHYL ESTERS 1 G PO CAPS
1.0000 g | ORAL_CAPSULE | Freq: Every day | ORAL | Status: DC
  Administered 2016-01-30 – 2016-02-01 (×3): 1 g via ORAL
  Filled 2016-01-30 (×3): qty 1

## 2016-01-30 MED ORDER — IOPAMIDOL (ISOVUE-370) INJECTION 76%
INTRAVENOUS | Status: AC
  Administered 2016-01-30: 50 mL via INTRAVENOUS
  Filled 2016-01-30: qty 50

## 2016-01-30 MED ORDER — ENOXAPARIN SODIUM 40 MG/0.4ML ~~LOC~~ SOLN
40.0000 mg | SUBCUTANEOUS | Status: DC
  Administered 2016-01-30 – 2016-02-01 (×3): 40 mg via SUBCUTANEOUS
  Filled 2016-01-30 (×3): qty 0.4

## 2016-01-30 MED ORDER — ASPIRIN 300 MG RE SUPP: 300.0000 mg | Freq: Every day | RECTAL | Status: DC

## 2016-01-30 MED ORDER — ASPIRIN 325 MG PO TABS
325.0000 mg | ORAL_TABLET | Freq: Every day | ORAL | Status: DC
  Administered 2016-01-30 – 2016-02-01 (×3): 325 mg via ORAL
  Filled 2016-01-30 (×3): qty 1

## 2016-01-30 MED ORDER — SODIUM CHLORIDE 0.9 % IV SOLN
INTRAVENOUS | Status: DC
  Administered 2016-01-30: 07:00:00 via INTRAVENOUS

## 2016-01-30 MED ORDER — STROKE: EARLY STAGES OF RECOVERY BOOK
Freq: Once | Status: AC
  Administered 2016-01-30: 07:00:00
  Filled 2016-01-30: qty 1

## 2016-01-30 MED ORDER — CLOPIDOGREL BISULFATE 75 MG PO TABS
75.0000 mg | ORAL_TABLET | Freq: Every day | ORAL | Status: DC
  Administered 2016-01-30 – 2016-02-01 (×3): 75 mg via ORAL
  Filled 2016-01-30 (×3): qty 1

## 2016-01-30 NOTE — Progress Notes (Signed)
PROGRESS NOTE  Trevor Bishop ZOX:[id number] DOB: [birth year] DOA: 01/30/2016 PCP: Lolita Patella, MD  HPI/Recap of past 24 hours: Trevor Bishop is a 80 y.o. male with gout and previous stroke presents to the ER because of dizziness, found on MRI to have anterior right paramedian pons infarction. Currently his dizziness and right sided weakness/tingling are much improved. He also had CT angiogram of the head to further evaluate posterior circulation due to concern for basilar aneurysm.  Assessment/Plan: 1. Right pontine stroke - appreciate neurology consult. Did discuss findings of basilar/vertebral stenosis and atherosclerosis with neurology on call who will notify stroke continuity team.   1. On aspirin.  2. physical therapy consult.  3. Check hemoglobin A1c and lipid panel.  4. Check 2-D echo, pending.  5. Follow up further recommendations regarding posterior circulation disease. 2. Elevated blood pressure - we'll allow for permissive hypertension until 8/23 AM. Closely monitor blood pressure trends. 3. Pancytopenia - cause not clear. Closely observe. Will need further workup as outpatient. 4. History of gout presently not taking any medications.  DVT ppx: Started Lovenox since no bleed on CTA.  Code Status: FULL   Family Communication: Nephew and step-daughter at bedside this AM.   Disposition Plan: Likely to home once workup is complete and we have final neurology recommendations.    Consultants:  Neurology   Procedures:  MRI Trevor Bishop  CTA Trevor Bishop  Echo   Antimicrobials:  None    Objective: Vitals:   01/30/16 0626 01/30/16 0849 01/30/16 1021 01/30/16 1219  BP: (!) 153/69 (!) 159/86 (!) 152/79 133/79  Pulse: 78 80 80 78  Resp: 20 20 20 18   Temp: 98.7 F (37.1 C) 98.8 F (37.1 C) 98.6 F (37 C) 98.6 F (37 C)  TempSrc: Oral Oral Oral Oral  SpO2: 95% 99% 98% 97%  Weight: 86.1 kg (189 lb 12.8 oz)     Height: 6\' 1"  (1.854 m)      No intake or output  data in the 24 hours ending 01/30/16 1257 Filed Weights   01/30/16 0626  Weight: 86.1 kg (189 lb 12.8 oz)    Exam: General:  Alert, oriented, calm, in no acute distress Eyes: pupils round and reactive to light and accomodation, clear sclerea Neck: supple, no masses, trachea mildline  Cardiovascular: RRR, no murmurs or rubs, no peripheral edema  Respiratory: clear to auscultation bilaterally, no wheezes, no crackles  Abdomen: soft, nontender, nondistended, normal bowel tones heard  Skin: dry, no rashes  Musculoskeletal: no joint effusions, normal range of motion  Psychiatric: appropriate affect, normal speech  Neurologic: extraocular muscles intact, clear speech, moving all extremities with intact sensorium    Data Reviewed: CBC:  Recent Labs Lab 01/29/16 2108 01/29/16 2135 01/30/16 0657  WBC 3.1*  --  2.6*  NEUTROABS 1.9  --   --   HGB 12.4* 13.3 12.5*  HCT 40.7 39.0 41.3  MCV 85.0  --  85.2  PLT 135*  --  121*   Basic Metabolic Panel:  Recent Labs Lab 01/29/16 2108 01/29/16 2135 01/30/16 0657  NA 141 143 142  K 3.6 3.6 3.6  CL 109 106 107  CO2 27  --  28  GLUCOSE 144* 142* 123*  BUN 8 8 8   CREATININE 0.94 0.90 0.93  CALCIUM 9.2  --  9.1   GFR: Estimated Creatinine Clearance: 75.2 mL/min (by C-G formula based on SCr of 0.93 mg/dL). Liver Function Tests:  Recent Labs Lab 01/29/16 2108 01/30/16 0657  AST  37 25  ALT 32 27  ALKPHOS 63 55  BILITOT 0.5 0.5  PROT 6.9 6.4*  ALBUMIN 4.0 3.7   No results for input(s): LIPASE, AMYLASE in the last 168 hours. No results for input(s): AMMONIA in the last 168 hours. Coagulation Profile:  Recent Labs Lab 01/29/16 2108  INR 1.13   Cardiac Enzymes: No results for input(s): CKTOTAL, CKMB, CKMBINDEX, TROPONINI in the last 168 hours. BNP (last 3 results) No results for input(s): PROBNP in the last 8760 hours. HbA1C: No results for input(s): HGBA1C in the last 72 hours. CBG: No results for input(s): GLUCAP  in the last 168 hours. Lipid Profile:  Recent Labs  01/30/16 0657  CHOL 102  HDL 32*  LDLCALC 57  TRIG 66  CHOLHDL 3.2   Thyroid Function Tests: No results for input(s): TSH, T4TOTAL, FREET4, T3FREE, THYROIDAB in the last 72 hours. Anemia Panel: No results for input(s): VITAMINB12, FOLATE, FERRITIN, TIBC, IRON, RETICCTPCT in the last 72 hours. Urine analysis:    Component Value Date/Time   COLORURINE ORANGE (A) 05/24/2015 1645   APPEARANCEUR CLOUDY (A) 05/24/2015 1645   LABSPEC 1.023 05/24/2015 1645   PHURINE 5.5 05/24/2015 1645   GLUCOSEU NEGATIVE 05/24/2015 1645   HGBUR LARGE (A) 05/24/2015 1645   BILIRUBINUR MODERATE (A) 05/24/2015 1645   KETONESUR 40 (A) 05/24/2015 1645   PROTEINUR 100 (A) 05/24/2015 1645   NITRITE NEGATIVE 05/24/2015 1645   LEUKOCYTESUR SMALL (A) 05/24/2015 1645   Sepsis Labs: @LABRCNTIP (procalcitonin:4,lacticidven:4)  )No results found for this or any previous visit (from the past 240 hour(s)).    Studies: Ct Angio Head W Or Wo Contrast  Addendum Date: 01/30/2016   ADDENDUM REPORT: 01/30/2016 08:59 ADDENDUM: Study discussed by telephone with Dr. Erenest Blank On 01/30/2016 at 0852 hours. We discussed that I favor the distal right vertebral artery and left PCA occlusions are chronic in light of no acute ischemia in those vascular territories on the MRI earlier today. Electronically Signed   By: Odessa Fleming M.D.   On: 01/30/2016 08:59   Result Date: 01/30/2016 CLINICAL DATA:  80 year old male with right para median pontine lacunar infarct. 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 Initial encounter. EXAM: CT ANGIOGRAPHY HEAD AND NECK TECHNIQUE: Multidetector CT imaging of the head and neck was performed using the standard protocol during bolus administration of intravenous contrast. Multiplanar CT image reconstructions and MIPs were obtained to evaluate the vascular anatomy. Carotid stenosis measurements (when applicable) are obtained  utilizing NASCET criteria, using the distal internal carotid diameter as the denominator. CONTRAST:  50 mL Isovue 370 COMPARISON:  Trevor Bishop MRI and MRA 01/30/2016. Head CT without contrast 01/29/2016. FINDINGS: CTA NECK Skeleton: Degenerative changes in the cervical spine. No acute osseous abnormality identified. Absent dentition Visualized paranasal sinuses and mastoids are stable and well pneumatized. Other neck: Partially visible cardiomegaly with coronary artery calcified atherosclerosis. Mild dependent opacity in the upper lungs compatible with atelectasis. No superior mediastinal lymphadenopathy. Negative thyroid, larynx (glottis is closed), pharynx, parapharyngeal spaces, retropharyngeal space, sublingual space, submandibular glands and parotid glands. Cervical lymph nodes are within normal limits. Aortic arch: 3 vessel arch configuration. Widespread soft atherosclerotic plaque in the arch. Mild calcified plaque. Right carotid system: Tortuous brachiocephalic artery and proximal right CCA with no stenosis. Mild calcified plaque at the right carotid bifurcation cava no stenosis. Mild calcified plaque of the right CCA just below the skullbase with no stenosis. Left carotid system: No left CCA origin stenosis despite soft and calcified plaque.  Tortuous proximal left CCA. Bulky combined soft and calcified plaque at the left carotid bifurcation mostly affecting the left ICA origin and proximal bulb. Subsequent high-grade stenosis numerically estimated at up to 66 % with respect to the distal vessel. Distal to the bulb there is only calcified plaque in the distal cervical left ICA just below the skullbase, with no associated stenosis. Vertebral arteries: No proximal right subclavian artery stenosis. Calcified plaque at the right vertebral artery origin with moderate stenosis (series 609, image 126). The right vertebral is non dominant and diminutive throughout the neck. It occludes in the right V3 segment at the C1  vertebral level on series 5, image 82. No proximal left subclavian stenosis. Normal left vertebral artery origin. The left V1 segment is somewhat dolichoectatic. The left vertebral is dominant, and widely patent to the skullbase. CTA HEAD Posterior circulation: The right V4 segment is occluded with no reconstituted flow. There is calcified plaque in the dominant left V4 segment with mild stenosis (series 609, image 126). The left vertebral supplies the basilar. Mild calcified plaque at the vertebrobasilar junction with mild stenosis. Right AICA origin is patent. The basilar artery is irregular, and there is a moderate to severe mid basilar stenosis (series 610, image 262 and series 609, image 104). This is at the level of the pons. The left lateral contour deformity of the vessel seen by MRA is re- demonstrated on series 609, image 105. There is no associated dissection, and this is not a saccular aneurysm. The distal basilar has a more normal caliber. The SCA and right PCA origins are normal. Posterior communicating arteries are diminutive or absent. Moderate to severe tandem stenoses of the proximal left PCA at its origin and in the P1 segment. The right P1 and P2 are normal. There is moderate irregularity of the right P3 branches. The left PCA is occluded in the distal P2 segment. Anterior circulation: Both ICA siphons are patent. There is moderate siphon calcified plaque bilaterally but no significant stenosis. Normal ophthalmic artery origins. Patent carotid termini. Normal MCA and ACA origins. The right A1 is dominant, the left is diminutive. Anterior communicating artery and bilateral ACA branches are normal. Left MCA M1 segment is tortuous. There is mild irregularity and stenosis in the distal M1. Left MCA bifurcation and branches are normal. Right MCA M1 segment, bifurcation, and right MCA branches are normal. Venous sinuses: Patent. Anatomic variants: Dominant left vertebral artery. Dominant right ACA A1  segment. Delayed phase: Stable supratentorial gray-white matter differentiation. The small pontine lacune remains occult by CT. No abnormal enhancement identified. IMPRESSION: 1. Severe posterior circulation atherosclerosis. Age indeterminate occlusions of the non dominant distal Right vertebral artery (V3 and V4 segments), and distal Left PCA (distal P2 and P3 segments). Otherwise no emergent large vessel occlusion. 2. Superimposed severe mid basilar stenosis, at the level of the pons. Severe stenosis of the proximal left PCA. 3. Small mid basilar contour deformity seen on recent MRA is felt related to atherosclerosis. No basilar artery dissection or aneurysm. 4. Atherosclerosis of the left carotid bifurcation with High-grade (66%) stenosis of the Left ICA origin. 5. Elsewhere calcified carotid atherosclerosis without associated stenosis. Mild for age anterior circulation atherosclerosis. 6. Stable CT appearance of the Trevor Bishop. Small pontine infarct remains occult by CT. 7. Cardiomegaly.  Calcified coronary artery atherosclerosis. Electronically Signed: By: Odessa Fleming M.D. On: 01/30/2016 08:34   Ct Head Wo Contrast  Result Date: 01/29/2016 CLINICAL DATA:  Dizziness and vomiting.  Right arm numbness EXAM: CT  HEAD WITHOUT CONTRAST TECHNIQUE: Contiguous axial images were obtained from the base of the skull through the vertex without intravenous contrast. COMPARISON:  None available FINDINGS: Trevor Bishop: No evidence of acute infarction, hemorrhage, hydrocephalus, extra-axial collection or mass lesion/mass effect. Generalized atrophy, mild for age. Small vessel ischemic gliosis in the cerebral white matter, mild for age. Remote appearing small vessel infarcts in the bilateral cerebellum. Vascular: Diffuse atherosclerotic calcification. Skull: Negative Sinuses/Orbits: Left cataract resection.  No acute finding. IMPRESSION: 1. No acute finding. 2. Mild for age atrophy and chronic microvascular disease. Chronic appearing small  vessel infarcts in the bilateral cerebellum. Electronically Signed   By: Marnee SpringJonathon  Watts M.D.   On: 01/29/2016 21:40   Ct Angio Neck W And/or Wo Contrast  Addendum Date: 01/30/2016   ADDENDUM REPORT: 01/30/2016 08:59 ADDENDUM: Study discussed by telephone with Dr. Erenest BlankIkram On 01/30/2016 at 0852 hours. We discussed that I favor the distal right vertebral artery and left PCA occlusions are chronic in light of no acute ischemia in those vascular territories on the MRI earlier today. Electronically Signed   By: Odessa FlemingH  Hall M.D.   On: 01/30/2016 08:59   Result Date: 01/30/2016 CLINICAL DATA:  80 year old male with right para median pontine lacunar infarct. 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 Initial encounter. EXAM: CT ANGIOGRAPHY HEAD AND NECK TECHNIQUE: Multidetector CT imaging of the head and neck was performed using the standard protocol during bolus administration of intravenous contrast. Multiplanar CT image reconstructions and MIPs were obtained to evaluate the vascular anatomy. Carotid stenosis measurements (when applicable) are obtained utilizing NASCET criteria, using the distal internal carotid diameter as the denominator. CONTRAST:  50 mL Isovue 370 COMPARISON:  Trevor Bishop MRI and MRA 01/30/2016. Head CT without contrast 01/29/2016. FINDINGS: CTA NECK Skeleton: Degenerative changes in the cervical spine. No acute osseous abnormality identified. Absent dentition Visualized paranasal sinuses and mastoids are stable and well pneumatized. Other neck: Partially visible cardiomegaly with coronary artery calcified atherosclerosis. Mild dependent opacity in the upper lungs compatible with atelectasis. No superior mediastinal lymphadenopathy. Negative thyroid, larynx (glottis is closed), pharynx, parapharyngeal spaces, retropharyngeal space, sublingual space, submandibular glands and parotid glands. Cervical lymph nodes are within normal limits. Aortic arch: 3 vessel arch configuration.  Widespread soft atherosclerotic plaque in the arch. Mild calcified plaque. Right carotid system: Tortuous brachiocephalic artery and proximal right CCA with no stenosis. Mild calcified plaque at the right carotid bifurcation cava no stenosis. Mild calcified plaque of the right CCA just below the skullbase with no stenosis. Left carotid system: No left CCA origin stenosis despite soft and calcified plaque. Tortuous proximal left CCA. Bulky combined soft and calcified plaque at the left carotid bifurcation mostly affecting the left ICA origin and proximal bulb. Subsequent high-grade stenosis numerically estimated at up to 66 % with respect to the distal vessel. Distal to the bulb there is only calcified plaque in the distal cervical left ICA just below the skullbase, with no associated stenosis. Vertebral arteries: No proximal right subclavian artery stenosis. Calcified plaque at the right vertebral artery origin with moderate stenosis (series 609, image 126). The right vertebral is non dominant and diminutive throughout the neck. It occludes in the right V3 segment at the C1 vertebral level on series 5, image 82. No proximal left subclavian stenosis. Normal left vertebral artery origin. The left V1 segment is somewhat dolichoectatic. The left vertebral is dominant, and widely patent to the skullbase. CTA HEAD Posterior circulation: The right V4 segment is occluded with no  reconstituted flow. There is calcified plaque in the dominant left V4 segment with mild stenosis (series 609, image 126). The left vertebral supplies the basilar. Mild calcified plaque at the vertebrobasilar junction with mild stenosis. Right AICA origin is patent. The basilar artery is irregular, and there is a moderate to severe mid basilar stenosis (series 610, image 262 and series 609, image 104). This is at the level of the pons. The left lateral contour deformity of the vessel seen by MRA is re- demonstrated on series 609, image 105. There is  no associated dissection, and this is not a saccular aneurysm. The distal basilar has a more normal caliber. The SCA and right PCA origins are normal. Posterior communicating arteries are diminutive or absent. Moderate to severe tandem stenoses of the proximal left PCA at its origin and in the P1 segment. The right P1 and P2 are normal. There is moderate irregularity of the right P3 branches. The left PCA is occluded in the distal P2 segment. Anterior circulation: Both ICA siphons are patent. There is moderate siphon calcified plaque bilaterally but no significant stenosis. Normal ophthalmic artery origins. Patent carotid termini. Normal MCA and ACA origins. The right A1 is dominant, the left is diminutive. Anterior communicating artery and bilateral ACA branches are normal. Left MCA M1 segment is tortuous. There is mild irregularity and stenosis in the distal M1. Left MCA bifurcation and branches are normal. Right MCA M1 segment, bifurcation, and right MCA branches are normal. Venous sinuses: Patent. Anatomic variants: Dominant left vertebral artery. Dominant right ACA A1 segment. Delayed phase: Stable supratentorial gray-white matter differentiation. The small pontine lacune remains occult by CT. No abnormal enhancement identified. IMPRESSION: 1. Severe posterior circulation atherosclerosis. Age indeterminate occlusions of the non dominant distal Right vertebral artery (V3 and V4 segments), and distal Left PCA (distal P2 and P3 segments). Otherwise no emergent large vessel occlusion. 2. Superimposed severe mid basilar stenosis, at the level of the pons. Severe stenosis of the proximal left PCA. 3. Small mid basilar contour deformity seen on recent MRA is felt related to atherosclerosis. No basilar artery dissection or aneurysm. 4. Atherosclerosis of the left carotid bifurcation with High-grade (66%) stenosis of the Left ICA origin. 5. Elsewhere calcified carotid atherosclerosis without associated stenosis. Mild  for age anterior circulation atherosclerosis. 6. Stable CT appearance of the Trevor Bishop. Small pontine infarct remains occult by CT. 7. Cardiomegaly.  Calcified coronary artery atherosclerosis. Electronically Signed: By: Odessa FlemingH  Hall M.D. On: 01/30/2016 08:34   Mr Trevor GlennMra Head Wo Contrast  Result Date: 01/30/2016 CLINICAL DATA:  80 y/o M; history of stroke, complaining of ongoing generalized weakness since yesterday. EXAM: MRI HEAD WITHOUT CONTRAST MRA HEAD WITHOUT CONTRAST TECHNIQUE: Multiplanar, multiecho pulse sequences of the Trevor Bishop and surrounding structures were obtained without intravenous contrast. Angiographic images of the head were obtained using MRA technique without contrast. COMPARISON:  CT head dated 01/29/2016. FINDINGS: MRI HEAD FINDINGS Trevor Bishop: Right anterior paramedian pontine focus of diffusion restriction measuring 7 mm, series 5, image 15, consistent with acute/ early subacute infarction. There is associated T2 FLAIR hyperintensity. No abnormal susceptibility hypointensity to indicate intracranial hemorrhage. Moderate chronic microvascular ischemic changes and parenchymal volume loss. Chronic lacunar infarct within the left cerebellar hemisphere. Extra-axial space: No hydrocephalus. No midline shift. No effacement of basilar cisterns. No extra-axial collection is identified. Proximal intracranial flow voids are maintained. No abnormality of the cervical medullary junction. Other: Mild mucosal thickening of the anterior ethmoid air cells. No abnormal signal of the mastoid air cells. Left  intra-ocular lens replacement. New Calvarium is unremarkable. MRA HEAD FINDINGS Internal carotid arteries: Mild irregularity bilaterally with short segments of mild stenosis. Anterior cerebral arteries: Large right A1, small left A1, and large anterior communicating artery, normal variant. Middle cerebral arteries: Patent bilaterally. Short segment of mild stenosis of distal left M1 segment. Anterior communicating artery:  Patent. Posterior communicating arteries: Left posterior communicating artery demonstrates an irregular flow voids in areas of narrowing, probably related to intracranial atherosclerosis. Posterior cerebral arteries: Diminished distal flow related signal left PCA of uncertain significance. Basilar artery:  Patent. Vertebral arteries: Patent left vertebral artery. No appreciable right vertebral artery, hypoplastic or occluded. Small proximal basilar signal void is probably a fenestration. The 3 mm left laterally directed mid basilar outpouching, series 6, image 91, may represent a aneurysm or dissection flap. Short segment of narrowing of the vessel downstream to the lesion at the level of infarction. IMPRESSION: 1. Mid basilar 3 mm outpouching may represent a small aneurysm or dissection flap. This can be further characterize with CT angiogram of the head. 2. Small focus of acute/early subacute infarction within the anterior right paramedian pons. 3. Areas of mild vessel irregularity and narrowing consistent with intracranial atherosclerosis. No large vessel occlusion. 4. Moderate chronic microvascular ischemic changes and parenchymal volume loss of the Trevor Bishop. Chronic left cerebellar lacunar infarct. 5. 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 These results were called by telephone at the time of interpretation on 01/30/2016 at 5:11 am to Dr. Ross Marcus , who verbally acknowledged these results. Electronically Signed   By: Mitzi Hansen M.D.   On: 01/30/2016 05:09   Mr Trevor Bishop Wo Contrast  Result Date: 01/30/2016 CLINICAL DATA:  80 y/o M; history of stroke, complaining of ongoing generalized weakness since yesterday. EXAM: MRI HEAD WITHOUT CONTRAST MRA HEAD WITHOUT CONTRAST TECHNIQUE: Multiplanar, multiecho pulse sequences of the Trevor Bishop and surrounding structures were obtained without intravenous contrast. Angiographic images of the head  were obtained using MRA technique without contrast. COMPARISON:  CT head dated 01/29/2016. FINDINGS: MRI HEAD FINDINGS Trevor Bishop: Right anterior paramedian pontine focus of diffusion restriction measuring 7 mm, series 5, image 15, consistent with acute/ early subacute infarction. There is associated T2 FLAIR hyperintensity. No abnormal susceptibility hypointensity to indicate intracranial hemorrhage. Moderate chronic microvascular ischemic changes and parenchymal volume loss. Chronic lacunar infarct within the left cerebellar hemisphere. Extra-axial space: No hydrocephalus. No midline shift. No effacement of basilar cisterns. No extra-axial collection is identified. Proximal intracranial flow voids are maintained. No abnormality of the cervical medullary junction. Other: Mild mucosal thickening of the anterior ethmoid air cells. No abnormal signal of the mastoid air cells. Left intra-ocular lens replacement. New Calvarium is unremarkable. MRA HEAD FINDINGS Internal carotid arteries: Mild irregularity bilaterally with short segments of mild stenosis. Anterior cerebral arteries: Large right A1, small left A1, and large anterior communicating artery, normal variant. Middle cerebral arteries: Patent bilaterally. Short segment of mild stenosis of distal left M1 segment. Anterior communicating artery: Patent. Posterior communicating arteries: Left posterior communicating artery demonstrates an irregular flow voids in areas of narrowing, probably related to intracranial atherosclerosis. Posterior cerebral arteries: Diminished distal flow related signal left PCA of uncertain significance. Basilar artery:  Patent. Vertebral arteries: Patent left vertebral artery. No appreciable right vertebral artery, hypoplastic or occluded. Small proximal basilar signal void is probably a fenestration. The 3 mm left laterally directed mid basilar outpouching, series 6, image 91, may represent a aneurysm or dissection flap. Short segment of  narrowing of the vessel downstream to the lesion at the level of infarction. IMPRESSION: 1. Mid basilar 3 mm outpouching may represent a small aneurysm or dissection flap. This can be further characterize with CT angiogram of the head. 2. Small focus of acute/early subacute infarction within the anterior right paramedian pons. 3. Areas of mild vessel irregularity and narrowing consistent with intracranial atherosclerosis. No large vessel occlusion. 4. Moderate chronic microvascular ischemic changes and parenchymal volume loss of the Trevor Bishop. Chronic left cerebellar lacunar infarct. 5. 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 These results were called by telephone at the time of interpretation on 01/30/2016 at 5:11 am to Dr. Ross Marcus , who verbally acknowledged these results. Electronically Signed   By: Mitzi Hansen M.D.   On: 01/30/2016 05:09    Scheduled Meds: . aspirin  300 mg Rectal Daily   Or  . aspirin  325 mg Oral Daily  . enoxaparin (LOVENOX) injection  40 mg Subcutaneous Q24H  . omega-3 acid ethyl esters  1 g Oral Daily    Continuous Infusions: . sodium chloride 75 mL/hr at 01/30/16 0639     LOS: 0 days   Time spent: 23 minutes  Paraskevi Funez Vergie Living, MD [facility] Pager [phone]  If 7PM-7AM, please contact night-coverage [email or web address] Password [facility] 01/30/2016, 12:57 PM

## 2016-01-30 NOTE — ED Notes (Signed)
Pt passed swallow eval without difficulty.  Pt is noted to be alert and oriented at this time.  18g PIV placed in pt's left upper forearm, PIV flushes without difficulty.

## 2016-01-30 NOTE — Care Management Obs Status (Signed)
MEDICARE OBSERVATION STATUS NOTIFICATION   Patient Details  Name: Trevor Bishop MRN: [id number] Date of Birth: August 24, 1935   Medicare Observation Status Notification Given:  Yes    Antony HasteBennett, [place]ah [place] Harris, RN 01/30/2016, 6:48 PM

## 2016-01-30 NOTE — Progress Notes (Signed)
STROKE TEAM PROGRESS NOTE   HISTORY OF PRESENT ILLNESS (per record) Trevor Bishop is an 80 y.o. male with a history of stroke as well as gout, presenting with complaint of onset of weakness at about 8:00 AM on 01/29/2016 along with numbness and slurred speech. Symptoms progressively lessened during the day and subsequently resolved. He has not been on antiplatelet therapy. CT scan of his head showed no acute findings. Old appearing cerebellar infarcts were noted. NIH stroke score was 0 at the time of this evaluation. He was LKW at 8:00 AM on 01/29/2016. Patient was not administered IV t-PA secondary to deficits resolved. He was admitted for further evaluation and treatment.   SUBJECTIVE (INTERVAL HISTORY) His family is at the bedside.  Overall he feels his condition is stable.    OBJECTIVE Temp:  [97.9 F (36.6 C)-98.7 F (37.1 C)] 98.7 F (37.1 C) (08/22 0626) Pulse Rate:  [52-78] 78 (08/22 0626) Cardiac Rhythm: Heart block;Normal sinus rhythm (08/22 0630) Resp:  [13-20] 20 (08/22 0626) BP: (146-168)/(60-95) 153/69 (08/22 0626) SpO2:  [95 %-100 %] 95 % (08/22 0626) Weight:  [86.1 kg (189 lb 12.8 oz)] 86.1 kg (189 lb 12.8 oz) (08/22 0626)  CBC:  Recent Labs Lab 01/29/16 2108 01/29/16 2135 01/30/16 0657  WBC 3.1*  --  2.6*  NEUTROABS 1.9  --   --   HGB 12.4* 13.3 12.5*  HCT 40.7 39.0 41.3  MCV 85.0  --  85.2  PLT 135*  --  121*    Basic Metabolic Panel:  Recent Labs Lab 01/29/16 2108 01/29/16 2135 01/30/16 0657  NA 141 143 142  K 3.6 3.6 3.6  CL 109 106 107  CO2 27  --  28  GLUCOSE 144* 142* 123*  BUN 8 8 8   CREATININE 0.94 0.90 0.93  CALCIUM 9.2  --  9.1    Lipid Panel:    Component Value Date/Time   CHOL 102 01/30/2016 0657   TRIG 66 01/30/2016 0657   HDL 32 (L) 01/30/2016 0657   CHOLHDL 3.2 01/30/2016 0657   VLDL 13 01/30/2016 0657   LDLCALC 57 01/30/2016 0657   HgbA1c: No results found for: HGBA1C Urine Drug Screen: No results found for: LABOPIA,  COCAINSCRNUR, LABBENZ, AMPHETMU, THCU, LABBARB    IMAGING  Ct Angio Head W Or Wo Contrast  Result Date: 01/30/2016 CLINICAL DATA:  80 year old male with right para median pontine lacunar infarct. 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 Initial encounter. EXAM: CT ANGIOGRAPHY HEAD AND NECK TECHNIQUE: Multidetector CT imaging of the head and neck was performed using the standard protocol during bolus administration of intravenous contrast. Multiplanar CT image reconstructions and MIPs were obtained to evaluate the vascular anatomy. Carotid stenosis measurements (when applicable) are obtained utilizing NASCET criteria, using the distal internal carotid diameter as the denominator. CONTRAST:  50 mL Isovue 370 COMPARISON:  Brain MRI and MRA 01/30/2016. Head CT without contrast 01/29/2016. FINDINGS: CTA NECK Skeleton: Degenerative changes in the cervical spine. No acute osseous abnormality identified. Absent dentition Visualized paranasal sinuses and mastoids are stable and well pneumatized. Other neck: Partially visible cardiomegaly with coronary artery calcified atherosclerosis. Mild dependent opacity in the upper lungs compatible with atelectasis. No superior mediastinal lymphadenopathy. Negative thyroid, larynx (glottis is closed), pharynx, parapharyngeal spaces, retropharyngeal space, sublingual space, submandibular glands and parotid glands. Cervical lymph nodes are within normal limits. Aortic arch: 3 vessel arch configuration. Widespread soft atherosclerotic plaque in the arch. Mild calcified plaque. Right carotid system:  Tortuous brachiocephalic artery and proximal right CCA with no stenosis. Mild calcified plaque at the right carotid bifurcation cava no stenosis. Mild calcified plaque of the right CCA just below the skullbase with no stenosis. Left carotid system: No left CCA origin stenosis despite soft and calcified plaque. Tortuous proximal left CCA. Bulky combined soft  and calcified plaque at the left carotid bifurcation mostly affecting the left ICA origin and proximal bulb. Subsequent high-grade stenosis numerically estimated at up to 66 % with respect to the distal vessel. Distal to the bulb there is only calcified plaque in the distal cervical left ICA just below the skullbase, with no associated stenosis. Vertebral arteries: No proximal right subclavian artery stenosis. Calcified plaque at the right vertebral artery origin with moderate stenosis (series 609, image 126). The right vertebral is non dominant and diminutive throughout the neck. It occludes in the right V3 segment at the C1 vertebral level on series 5, image 82. No proximal left subclavian stenosis. Normal left vertebral artery origin. The left V1 segment is somewhat dolichoectatic. The left vertebral is dominant, and widely patent to the skullbase. CTA HEAD Posterior circulation: The right V4 segment is occluded with no reconstituted flow. There is calcified plaque in the dominant left V4 segment with mild stenosis (series 609, image 126). The left vertebral supplies the basilar. Mild calcified plaque at the vertebrobasilar junction with mild stenosis. Right AICA origin is patent. The basilar artery is irregular, and there is a moderate to severe mid basilar stenosis (series 610, image 262 and series 609, image 104). This is at the level of the pons. The left lateral contour deformity of the vessel seen by MRA is re- demonstrated on series 609, image 105. There is no associated dissection, and this is not a saccular aneurysm. The distal basilar has a more normal caliber. The SCA and right PCA origins are normal. Posterior communicating arteries are diminutive or absent. Moderate to severe tandem stenoses of the proximal left PCA at its origin and in the P1 segment. The right P1 and P2 are normal. There is moderate irregularity of the right P3 branches. The left PCA is occluded in the distal P2 segment. Anterior  circulation: Both ICA siphons are patent. There is moderate siphon calcified plaque bilaterally but no significant stenosis. Normal ophthalmic artery origins. Patent carotid termini. Normal MCA and ACA origins. The right A1 is dominant, the left is diminutive. Anterior communicating artery and bilateral ACA branches are normal. Left MCA M1 segment is tortuous. There is mild irregularity and stenosis in the distal M1. Left MCA bifurcation and branches are normal. Right MCA M1 segment, bifurcation, and right MCA branches are normal. Venous sinuses: Patent. Anatomic variants: Dominant left vertebral artery. Dominant right ACA A1 segment. Delayed phase: Stable supratentorial gray-white matter differentiation. The small pontine lacune remains occult by CT. No abnormal enhancement identified. IMPRESSION: 1. Severe posterior circulation atherosclerosis. Age indeterminate occlusions of the non dominant distal Right vertebral artery (V3 and V4 segments), and distal Left PCA (distal P2 and P3 segments). Otherwise no emergent large vessel occlusion. 2. Superimposed severe mid basilar stenosis, at the level of the pons. Severe stenosis of the proximal left PCA. 3. Small mid basilar contour deformity seen on recent MRA is felt related to atherosclerosis. No basilar artery dissection or aneurysm. 4. Atherosclerosis of the left carotid bifurcation with High-grade (66%) stenosis of the Left ICA origin. 5. Elsewhere calcified carotid atherosclerosis without associated stenosis. Mild for age anterior circulation atherosclerosis. 6. Stable CT appearance of  the brain. Small pontine infarct remains occult by CT. 7. Cardiomegaly.  Calcified coronary artery atherosclerosis. Electronically Signed   By: Odessa Fleming M.D.   On: 01/30/2016 08:34   Ct Head Wo Contrast  Result Date: 01/29/2016 CLINICAL DATA:  Dizziness and vomiting.  Right arm numbness EXAM: CT HEAD WITHOUT CONTRAST TECHNIQUE: Contiguous axial images were obtained from the base  of the skull through the vertex without intravenous contrast. COMPARISON:  None available FINDINGS: Brain: No evidence of acute infarction, hemorrhage, hydrocephalus, extra-axial collection or mass lesion/mass effect. Generalized atrophy, mild for age. Small vessel ischemic gliosis in the cerebral white matter, mild for age. Remote appearing small vessel infarcts in the bilateral cerebellum. Vascular: Diffuse atherosclerotic calcification. Skull: Negative Sinuses/Orbits: Left cataract resection.  No acute finding. IMPRESSION: 1. No acute finding. 2. Mild for age atrophy and chronic microvascular disease. Chronic appearing small vessel infarcts in the bilateral cerebellum. Electronically Signed   By: Marnee Spring M.D.   On: 01/29/2016 21:40   Ct Angio Neck W And/or Wo Contrast  Result Date: 01/30/2016 CLINICAL DATA:  80 year old male with right para median pontine lacunar infarct. 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 Initial encounter. EXAM: CT ANGIOGRAPHY HEAD AND NECK TECHNIQUE: Multidetector CT imaging of the head and neck was performed using the standard protocol during bolus administration of intravenous contrast. Multiplanar CT image reconstructions and MIPs were obtained to evaluate the vascular anatomy. Carotid stenosis measurements (when applicable) are obtained utilizing NASCET criteria, using the distal internal carotid diameter as the denominator. CONTRAST:  50 mL Isovue 370 COMPARISON:  Brain MRI and MRA 01/30/2016. Head CT without contrast 01/29/2016. FINDINGS: CTA NECK Skeleton: Degenerative changes in the cervical spine. No acute osseous abnormality identified. Absent dentition Visualized paranasal sinuses and mastoids are stable and well pneumatized. Other neck: Partially visible cardiomegaly with coronary artery calcified atherosclerosis. Mild dependent opacity in the upper lungs compatible with atelectasis. No superior mediastinal lymphadenopathy. Negative  thyroid, larynx (glottis is closed), pharynx, parapharyngeal spaces, retropharyngeal space, sublingual space, submandibular glands and parotid glands. Cervical lymph nodes are within normal limits. Aortic arch: 3 vessel arch configuration. Widespread soft atherosclerotic plaque in the arch. Mild calcified plaque. Right carotid system: Tortuous brachiocephalic artery and proximal right CCA with no stenosis. Mild calcified plaque at the right carotid bifurcation cava no stenosis. Mild calcified plaque of the right CCA just below the skullbase with no stenosis. Left carotid system: No left CCA origin stenosis despite soft and calcified plaque. Tortuous proximal left CCA. Bulky combined soft and calcified plaque at the left carotid bifurcation mostly affecting the left ICA origin and proximal bulb. Subsequent high-grade stenosis numerically estimated at up to 66 % with respect to the distal vessel. Distal to the bulb there is only calcified plaque in the distal cervical left ICA just below the skullbase, with no associated stenosis. Vertebral arteries: No proximal right subclavian artery stenosis. Calcified plaque at the right vertebral artery origin with moderate stenosis (series 609, image 126). The right vertebral is non dominant and diminutive throughout the neck. It occludes in the right V3 segment at the C1 vertebral level on series 5, image 82. No proximal left subclavian stenosis. Normal left vertebral artery origin. The left V1 segment is somewhat dolichoectatic. The left vertebral is dominant, and widely patent to the skullbase. CTA HEAD Posterior circulation: The right V4 segment is occluded with no reconstituted flow. There is calcified plaque in the dominant left V4 segment with mild stenosis (series 609,  image 126). The left vertebral supplies the basilar. Mild calcified plaque at the vertebrobasilar junction with mild stenosis. Right AICA origin is patent. The basilar artery is irregular, and there is a  moderate to severe mid basilar stenosis (series 610, image 262 and series 609, image 104). This is at the level of the pons. The left lateral contour deformity of the vessel seen by MRA is re- demonstrated on series 609, image 105. There is no associated dissection, and this is not a saccular aneurysm. The distal basilar has a more normal caliber. The SCA and right PCA origins are normal. Posterior communicating arteries are diminutive or absent. Moderate to severe tandem stenoses of the proximal left PCA at its origin and in the P1 segment. The right P1 and P2 are normal. There is moderate irregularity of the right P3 branches. The left PCA is occluded in the distal P2 segment. Anterior circulation: Both ICA siphons are patent. There is moderate siphon calcified plaque bilaterally but no significant stenosis. Normal ophthalmic artery origins. Patent carotid termini. Normal MCA and ACA origins. The right A1 is dominant, the left is diminutive. Anterior communicating artery and bilateral ACA branches are normal. Left MCA M1 segment is tortuous. There is mild irregularity and stenosis in the distal M1. Left MCA bifurcation and branches are normal. Right MCA M1 segment, bifurcation, and right MCA branches are normal. Venous sinuses: Patent. Anatomic variants: Dominant left vertebral artery. Dominant right ACA A1 segment. Delayed phase: Stable supratentorial gray-white matter differentiation. The small pontine lacune remains occult by CT. No abnormal enhancement identified. IMPRESSION: 1. Severe posterior circulation atherosclerosis. Age indeterminate occlusions of the non dominant distal Right vertebral artery (V3 and V4 segments), and distal Left PCA (distal P2 and P3 segments). Otherwise no emergent large vessel occlusion. 2. Superimposed severe mid basilar stenosis, at the level of the pons. Severe stenosis of the proximal left PCA. 3. Small mid basilar contour deformity seen on recent MRA is felt related to  atherosclerosis. No basilar artery dissection or aneurysm. 4. Atherosclerosis of the left carotid bifurcation with High-grade (66%) stenosis of the Left ICA origin. 5. Elsewhere calcified carotid atherosclerosis without associated stenosis. Mild for age anterior circulation atherosclerosis. 6. Stable CT appearance of the brain. Small pontine infarct remains occult by CT. 7. Cardiomegaly.  Calcified coronary artery atherosclerosis. Electronically Signed   By: Odessa Fleming M.D.   On: 01/30/2016 08:34   Mr Maxine Glenn Head Wo Contrast  Result Date: 01/30/2016 CLINICAL DATA:  80 y/o M; history of stroke, complaining of ongoing generalized weakness since yesterday. EXAM: MRI HEAD WITHOUT CONTRAST MRA HEAD WITHOUT CONTRAST TECHNIQUE: Multiplanar, multiecho pulse sequences of the brain and surrounding structures were obtained without intravenous contrast. Angiographic images of the head were obtained using MRA technique without contrast. COMPARISON:  CT head dated 01/29/2016. FINDINGS: MRI HEAD FINDINGS Brain: Right anterior paramedian pontine focus of diffusion restriction measuring 7 mm, series 5, image 15, consistent with acute/ early subacute infarction. There is associated T2 FLAIR hyperintensity. No abnormal susceptibility hypointensity to indicate intracranial hemorrhage. Moderate chronic microvascular ischemic changes and parenchymal volume loss. Chronic lacunar infarct within the left cerebellar hemisphere. Extra-axial space: No hydrocephalus. No midline shift. No effacement of basilar cisterns. No extra-axial collection is identified. Proximal intracranial flow voids are maintained. No abnormality of the cervical medullary junction. Other: Mild mucosal thickening of the anterior ethmoid air cells. No abnormal signal of the mastoid air cells. Left intra-ocular lens replacement. New Calvarium is unremarkable. MRA HEAD FINDINGS Internal carotid arteries:  Mild irregularity bilaterally with short segments of mild stenosis.  Anterior cerebral arteries: Large right A1, small left A1, and large anterior communicating artery, normal variant. Middle cerebral arteries: Patent bilaterally. Short segment of mild stenosis of distal left M1 segment. Anterior communicating artery: Patent. Posterior communicating arteries: Left posterior communicating artery demonstrates an irregular flow voids in areas of narrowing, probably related to intracranial atherosclerosis. Posterior cerebral arteries: Diminished distal flow related signal left PCA of uncertain significance. Basilar artery:  Patent. Vertebral arteries: Patent left vertebral artery. No appreciable right vertebral artery, hypoplastic or occluded. Small proximal basilar signal void is probably a fenestration. The 3 mm left laterally directed mid basilar outpouching, series 6, image 91, may represent a aneurysm or dissection flap. Short segment of narrowing of the vessel downstream to the lesion at the level of infarction. IMPRESSION: 1. Mid basilar 3 mm outpouching may represent a small aneurysm or dissection flap. This can be further characterize with CT angiogram of the head. 2. Small focus of acute/early subacute infarction within the anterior right paramedian pons. 3. Areas of mild vessel irregularity and narrowing consistent with intracranial atherosclerosis. No large vessel occlusion. 4. Moderate chronic microvascular ischemic changes and parenchymal volume loss of the brain. Chronic left cerebellar lacunar infarct. 5. 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 These results were called by telephone at the time of interpretation on 01/30/2016 at 5:11 am to Dr. Ross MarcusOURTNEY HORTON , who verbally acknowledged these results. Electronically Signed   By: Mitzi HansenLance  Furusawa-Stratton M.D.   On: 01/30/2016 05:09   Mr Brain Wo Contrast  Result Date: 01/30/2016 CLINICAL DATA:  80 y/o M; history of stroke, complaining of ongoing generalized  weakness since yesterday. EXAM: MRI HEAD WITHOUT CONTRAST MRA HEAD WITHOUT CONTRAST TECHNIQUE: Multiplanar, multiecho pulse sequences of the brain and surrounding structures were obtained without intravenous contrast. Angiographic images of the head were obtained using MRA technique without contrast. COMPARISON:  CT head dated 01/29/2016. FINDINGS: MRI HEAD FINDINGS Brain: Right anterior paramedian pontine focus of diffusion restriction measuring 7 mm, series 5, image 15, consistent with acute/ early subacute infarction. There is associated T2 FLAIR hyperintensity. No abnormal susceptibility hypointensity to indicate intracranial hemorrhage. Moderate chronic microvascular ischemic changes and parenchymal volume loss. Chronic lacunar infarct within the left cerebellar hemisphere. Extra-axial space: No hydrocephalus. No midline shift. No effacement of basilar cisterns. No extra-axial collection is identified. Proximal intracranial flow voids are maintained. No abnormality of the cervical medullary junction. Other: Mild mucosal thickening of the anterior ethmoid air cells. No abnormal signal of the mastoid air cells. Left intra-ocular lens replacement. New Calvarium is unremarkable. MRA HEAD FINDINGS Internal carotid arteries: Mild irregularity bilaterally with short segments of mild stenosis. Anterior cerebral arteries: Large right A1, small left A1, and large anterior communicating artery, normal variant. Middle cerebral arteries: Patent bilaterally. Short segment of mild stenosis of distal left M1 segment. Anterior communicating artery: Patent. Posterior communicating arteries: Left posterior communicating artery demonstrates an irregular flow voids in areas of narrowing, probably related to intracranial atherosclerosis. Posterior cerebral arteries: Diminished distal flow related signal left PCA of uncertain significance. Basilar artery:  Patent. Vertebral arteries: Patent left vertebral artery. No appreciable right  vertebral artery, hypoplastic or occluded. Small proximal basilar signal void is probably a fenestration. The 3 mm left laterally directed mid basilar outpouching, series 6, image 91, may represent a aneurysm or dissection flap. Short segment of narrowing of the vessel downstream to the lesion at the level of infarction.  IMPRESSION: 1. Mid basilar 3 mm outpouching may represent a small aneurysm or dissection flap. This can be further characterize with CT angiogram of the head. 2. Small focus of acute/early subacute infarction within the anterior right paramedian pons. 3. Areas of mild vessel irregularity and narrowing consistent with intracranial atherosclerosis. No large vessel occlusion. 4. Moderate chronic microvascular ischemic changes and parenchymal volume loss of the brain. Chronic left cerebellar lacunar infarct. 5. 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 These results were called by telephone at the time of interpretation on 01/30/2016 at 5:11 am to Dr. Ross Marcus , who verbally acknowledged these results. Electronically Signed   By: Mitzi Hansen M.D.   On: 01/30/2016 05:09   2D Echocardiogram  - Left ventricle: The cavity size was normal. Wall thickness was increased in a pattern of severe LVH. Systolic function was vigorous. The estimated ejection fraction was in the range of 65% to 70%. Wall motion was normal; there were no regional wall motion abnormalities. Doppler parameters are consistent with abnormal left ventricular relaxation (grade 1 diastolic dysfunction). The E/e&' ratio is between 8-15, suggesting indeterminate LV filling pressure. - Mitral valve: Calcified annulus. There was trivial regurgitation. - Left atrium: The atrium was normal in size. - Inferior vena cava: The vessel was normal in size. The respirophasic diameter changes were in the normal range (>= 50%), consistent with normal central venous  pressure. Impressions:   LVEF 65-70%, severe LVH, normal wall motion, diastolic dysfunction, indeterminate LV filling pressure, trivial MR, normal LA size, normal IVC.   PHYSICAL EXAM Pleasant elderly male currently not in distress. . Afebrile. Head is nontraumatic. Neck is supple without bruit.    Cardiac exam no murmur or gallop. Lungs are clear to auscultation. Distal pulses are well felt.  Neurological Exam :  Awake  Alert oriented x 3. Normal speech and language.eye movements full without nystagmus.fundi were not visualized. Vision acuity and fields appear normal. Hearing is normal. Palatal movements are normal. Face symmetric. Tongue midline. Normal strength, tone, reflexes and coordination. Normal sensation. Gait deferred.  ASSESSMENT/PLAN Trevor Bishop is a 80 y.o. male with history of stroke and gout presenting with R sided weakness. He did not receive IV t-PA due to deficits resolved.   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 L ICA  66% stenosis. Small pontine infarct remains occult   MRI & MRA  Mid basilar 3 mm outpouching, possible small aneurysm or dissection flap. acute/subacute infarction anterior R paramedian pons. No large vessel occlusion. small vessel disease Old L cerebellar lacunar   2D Echo  EF 65-70%. Severe LVH. No source of embolus   LDL 57  HgbA1c pending  Lovenox 40 mg sq daily for VTE prophylaxis  Diet Heart Room service appropriate? Yes; Fluid consistency: Thin  No antithrombotic prior to admission, now on aspirin 325 mg daily  Patient counseled to be compliant with his antithrombotic medications  Ongoing aggressive stroke risk factor management  Therapy recommendations:  HH PT  Disposition:  Return home  Elevated BP  Hyperlipidemia  Home meds:  lovaza, resumed in  hospital  LDL 57, at goal < 70  Other Stroke Risk Factors  Advanced age  Other Active Problems  Pancytopenia  Hx gout  Hospital day # 0  [facility] See  Amion for Pager information 01/30/2016 1:37 PM  I have personally examined this patient, reviewed notes, independently viewed imaging studies, participated in medical decision making and plan of care. I have made any additions or clarifications directly to the above note. Agree with note above.  He presented with transient slurred speech and numbness due to a small pontine infarct and CT angiogram shows severe basilar stenosis in posterior circulation atherosclerotic changes. He remains at risk for neurological worsening, recurrent stroke, TIA and needs ongoing stroke evaluation. Recommend dual antiplatelet therapy for 3 months followed by Plavix alone. Aggressive risk factor control. Greater than 50% time during this 25 minute visit was spent on counseling and coordination of care about stroke risk, prevention and treatment Delia Heady, MD Medical Director [facility] Pager: [phone] 01/30/2016 3:02 PM    To contact Stroke Continuity provider, please refer to [email or web address]. After hours, contact General Neurology

## 2016-01-30 NOTE — ED Provider Notes (Signed)
[facility]-EMERGENCY DEPT Provider Note   CSN: [id number] Arrival date & time: 01/29/16  2054  By signing my name below, I, Emmanuella Mensah, attest that this documentation has been prepared under the direction and in the presence of Shon Baton, MD. Electronically Signed: Angelene Giovanni, ED Scribe. 01/30/16. 1:46 AM.    History   Chief Complaint Chief Complaint  Patient presents with  . Dizziness    HPI Comments: CADAN MAGGART is a 80 y.o. male with a hx of stroke, arthritis, and gout who presents to the Emergency Department complaining of ongoing generalized weakness onset yesterday am. Pt explains that he had sudden onset of one episode of non-bloody vomiting with right hand numbness and dizziness that occurred yesterday am. He adds that he had these symptoms approx one week ago and self-resolved. He reported a "funny feeling with swallowing."  He also reports generalized weakness and difficulty walking.  Currently, his symptoms have resolved.  He denies any anti-coagulant use. Pt was seen at [facility] yesterday and was advised to come to the ED for concerns of a TIA. He denies any abdominal pain, diarrhea, nausea, fever, chills, chest pain, or SOB. He reports that he is currently feeling at his baseline.   The history is provided by the patient. No language interpreter was used.    Past Medical History:  Diagnosis Date  . Allergy   . Arthritis   . Gout   . Stroke [facility])     Patient Active Problem List   Diagnosis Date Noted  . Fever 05/24/2015  . SIRS (systemic inflammatory response syndrome) (HCC) 05/24/2015  . Gout attack 05/24/2015  . Acute gout     Past Surgical History:  Procedure Laterality Date  . EYE SURGERY         Home Medications    Prior to Admission medications   Medication Sig Start Date End Date Taking? Authorizing Provider  ibuprofen (ADVIL,MOTRIN) 200 MG tablet Take 400 mg by mouth every 6 (six) hours as needed for moderate pain.   Yes Historical  Provider, MD  Menthol, Topical Analgesic, (ICY HOT POWER) 16 % GEL Apply 1 application topically daily as needed (general pain / arm).   Yes Historical Provider, MD  naproxen sodium (ANAPROX) 220 MG tablet Take 440 mg by mouth daily as needed (general pain).   Yes Historical Provider, MD  omega-3 acid ethyl esters (LOVAZA) 1 g capsule Take 1 g by mouth daily.   Yes Historical Provider, MD  OVER THE COUNTER MEDICATION Take 1 tablet by mouth daily. "Prostratus"   Yes Historical Provider, MD    Family History Family History  Problem Relation Age of Onset  . Gout Neg Hx   . Diabetes Mellitus II Neg Hx     Social History Social History  Substance Use Topics  . Smoking status: Never Smoker  . Smokeless tobacco: Never Used  . Alcohol use No     Allergies   Review of patient's allergies indicates no known allergies.   Review of Systems Review of Systems  Constitutional: Negative for chills and fever.  Respiratory: Negative for shortness of breath.   Cardiovascular: Negative for chest pain.  Gastrointestinal: Positive for vomiting. Negative for abdominal pain, diarrhea and nausea.  Neurological: Positive for dizziness, weakness and numbness.  All other systems reviewed and are negative.    Physical Exam Updated Vital Signs BP 149/76   Pulse 75   Temp 97.9 F (36.6 C) (Oral)   Resp 15   SpO2 99%  Physical Exam  Constitutional: He is oriented to person, place, and time. He appears well-developed and well-nourished.  Elderly, no acute distress  HENT:  Head: Normocephalic and atraumatic.  Eyes: Pupils are equal, round, and reactive to light.  Cardiovascular: Normal rate and normal heart sounds.   No murmur heard. Irregular rhythm  Pulmonary/Chest: Effort normal and breath sounds normal. No respiratory distress. He has no wheezes.  Abdominal: Soft. Bowel sounds are normal. There is no tenderness. There is no rebound.  Musculoskeletal: He exhibits edema.  Trace bilateral  lower extremity edema  Neurological: He is alert and oriented to person, place, and time.  Cranial nerves II through XII intact, 5 out of 5 strength in all 4 extremities, no drift, no dysmetria to finger-nose-finger  Skin: Skin is warm and dry.  Psychiatric: He has a normal mood and affect.  Nursing note and vitals reviewed.    ED Treatments / Results  DIAGNOSTIC STUDIES: Oxygen Saturation is 97% on RA, normal by my interpretation.    COORDINATION OF CARE: 1:31 AM- Pt advised of plan for treatment and pt agrees. Pt will receive lab work, EKG, and CT head for further evaluation.    Labs (all labs ordered are listed, but only abnormal results are displayed) Labs Reviewed  CBC - Abnormal; Notable for the following:       Result Value   WBC 3.1 (*)    Hemoglobin 12.4 (*)    MCH 25.9 (*)    Platelets 135 (*)    All other components within normal limits  COMPREHENSIVE METABOLIC PANEL - Abnormal; Notable for the following:    Glucose, Bld 144 (*)    All other components within normal limits  I-STAT CHEM 8, ED - Abnormal; Notable for the following:    Glucose, Bld 142 (*)    All other components within normal limits  PROTIME-INR  APTT  DIFFERENTIAL  ETHANOL  I-STAT TROPOININ, ED  CBG MONITORING, ED    EKG  EKG Interpretation  Date/Time:  Tuesday January 30 2016 01:50:40 EDT Ventricular Rate:  75 PR Interval:    QRS Duration: 102 QT Interval:  382 QTC Calculation: 427 R Axis:   -13 Text Interpretation:  Sinus rhythm Ventricular bigeminy Borderline prolonged PR interval Probable left atrial enlargement Confirmed by Wilkie AyeHORTON  MD, COURTNEY ([id number]) on 01/30/2016 2:40:10 AM       Radiology Ct Head Wo Contrast  Result Date: 01/29/2016 CLINICAL DATA:  Dizziness and vomiting.  Right arm numbness EXAM: CT HEAD WITHOUT CONTRAST TECHNIQUE: Contiguous axial images were obtained from the base of the skull through the vertex without intravenous contrast. COMPARISON:  None available  FINDINGS: Brain: No evidence of acute infarction, hemorrhage, hydrocephalus, extra-axial collection or mass lesion/mass effect. Generalized atrophy, mild for age. Small vessel ischemic gliosis in the cerebral white matter, mild for age. Remote appearing small vessel infarcts in the bilateral cerebellum. Vascular: Diffuse atherosclerotic calcification. Skull: Negative Sinuses/Orbits: Left cataract resection.  No acute finding. IMPRESSION: 1. No acute finding. 2. Mild for age atrophy and chronic microvascular disease. Chronic appearing small vessel infarcts in the bilateral cerebellum. Electronically Signed   By: Marnee SpringJonathon  Watts M.D.   On: 01/29/2016 21:40    Procedures Procedures (including critical care time)  Medications Ordered in ED Medications - No data to display   Initial Impression / Assessment and Plan / ED Course  Shon Batonourtney F Horton, MD has reviewed the triage vital signs and the nursing notes.  Pertinent labs & imaging results that were  available during my care of the patient were reviewed by me and considered in my medical decision making (see chart for details).  Clinical Course   Patient presents with symptoms that started yesterday morning and has since resolved. Reports right hand and arm numbness and generalized weakness with difficulty walking and dizziness. He is currently neurologically intact. Vital signs are reassuring. History is concerning for TIA. Workup initially is largely reassuring. Discussed with neurology, Dr. Roseanne RenoStewart. He will evaluate the patient. Will admit for a TIA workup.  Final Clinical Impressions(s) / ED Diagnoses   Final diagnoses:  Other specified transient cerebral ischemias    New Prescriptions New Prescriptions   No medications on file   I personally performed the services described in this documentation, which was scribed in my presence. The recorded information has been reviewed and is accurate.     Shon Batonourtney F Horton, MD 01/30/16 [phone]

## 2016-01-30 NOTE — ED Notes (Signed)
Pt transported to MRI at this time via ED stretcher. Pt in no apparent distress at this time.

## 2016-01-30 NOTE — Progress Notes (Signed)
  Echocardiogram 2D Echocardiogram has been performed.  Trevor Bishop, Trevor Bishop 01/30/2016, 9:51 AM

## 2016-01-30 NOTE — H&P (Signed)
History and Physical    Trevor Bishop YQI:[id number] DOB: 1936/01/01 DOA: 01/30/2016  PCP: Lolita Patella, MD  Patient coming from: Home.  Chief Complaint: Dizziness.  HPI: Trevor Bishop is a 80 y.o. male with gout and previous stroke presents to the ER because of dizziness. Patient started experiencing dizziness yesterday morning around 8 AM along with weakness of the right lower extremity and numbness of the right upper extremities. Patient felt dizzy whenever he stood up with no associated nausea vomiting. Had some right temporal headache. Patient's dizziness and right lower extremity weakness and right upper extremity numbness all resolved in one hour. Later in the evening patient came to the ER. MRI of the brain shows right pontine infarct and possible dissection versus aneurysm of the basilar artery. On-call neurologist Dr. Roseanne Reno has been consulted patient is being admitted for further management. On exam patient appears nonfocal. Headache has resolved. Denies any blurred vision or difficulty swallowing or speaking.   ED Course: MRI/MRA brain was done.  Review of Systems: As per HPI, rest all negative.   Past Medical History:  Diagnosis Date  . Allergy   . Arthritis   . Gout   . Stroke [facility])     Past Surgical History:  Procedure Laterality Date  . EYE SURGERY       reports that he has never smoked. He has never used smokeless tobacco. He reports that he does not drink alcohol. His drug history is not on file.  No Known Allergies  Family History  Problem Relation Age of Onset  . Gout Neg Hx   . Diabetes Mellitus II Neg Hx     Prior to Admission medications   Medication Sig Start Date End Date Taking? Authorizing Provider  ibuprofen (ADVIL,MOTRIN) 200 MG tablet Take 400 mg by mouth every 6 (six) hours as needed for moderate pain.   Yes Historical Provider, MD  Menthol, Topical Analgesic, (ICY HOT POWER) 16 % GEL Apply 1 application topically daily as needed  (general pain / arm).   Yes Historical Provider, MD  naproxen sodium (ANAPROX) 220 MG tablet Take 440 mg by mouth daily as needed (general pain).   Yes Historical Provider, MD  omega-3 acid ethyl esters (LOVAZA) 1 g capsule Take 1 g by mouth daily.   Yes Historical Provider, MD  OVER THE COUNTER MEDICATION Take 1 tablet by mouth daily. "Prostratus"   Yes Historical Provider, MD    Physical Exam: Vitals:   01/30/16 0300 01/30/16 0330 01/30/16 0507 01/30/16 0508  BP: 153/60 146/76 158/95   Pulse: 73 73  77  Resp: 16 16  19   Temp:      TempSrc:      SpO2: 100% 100%  98%      Constitutional: Not in distress. Vitals:   01/30/16 0300 01/30/16 0330 01/30/16 0507 01/30/16 0508  BP: 153/60 146/76 158/95   Pulse: 73 73  77  Resp: 16 16  19   Temp:      TempSrc:      SpO2: 100% 100%  98%   Eyes: Anicteric no pallor. ENMT: No discharge from the ears eyes nose or mouth. Neck: No neck rigidity. No mass felt. Respiratory: No rhonchi or crepitations. Cardiovascular: S1 and S2 heard. Abdomen: Soft nontender bowel sounds present. No guarding or rigidity. Musculoskeletal: No edema. Skin: No rash. Neurologic: Alert awake oriented to time place and person. Moves all extremities 5 x 5. No facial asymmetry. Tongue is midline. PERRLA positive. Psychiatric: Appears normal.  Labs on Admission: I have personally reviewed following labs and imaging studies  CBC:  Recent Labs Lab 01/29/16 2108 01/29/16 2135  WBC 3.1*  --   NEUTROABS 1.9  --   HGB 12.4* 13.3  HCT 40.7 39.0  MCV 85.0  --   PLT 135*  --    Basic Metabolic Panel:  Recent Labs Lab 01/29/16 2108 01/29/16 2135  NA 141 143  K 3.6 3.6  CL 109 106  CO2 27  --   GLUCOSE 144* 142*  BUN 8 8  CREATININE 0.94 0.90  CALCIUM 9.2  --    GFR: CrCl cannot be calculated (Unknown ideal weight.). Liver Function Tests:  Recent Labs Lab 01/29/16 2108  AST 37  ALT 32  ALKPHOS 63  BILITOT 0.5  PROT 6.9  ALBUMIN 4.0   No  results for input(s): LIPASE, AMYLASE in the last 168 hours. No results for input(s): AMMONIA in the last 168 hours. Coagulation Profile:  Recent Labs Lab 01/29/16 2108  INR 1.13   Cardiac Enzymes: No results for input(s): CKTOTAL, CKMB, CKMBINDEX, TROPONINI in the last 168 hours. BNP (last 3 results) No results for input(s): PROBNP in the last 8760 hours. HbA1C: No results for input(s): HGBA1C in the last 72 hours. CBG: No results for input(s): GLUCAP in the last 168 hours. Lipid Profile: No results for input(s): CHOL, HDL, LDLCALC, TRIG, CHOLHDL, LDLDIRECT in the last 72 hours. Thyroid Function Tests: No results for input(s): TSH, T4TOTAL, FREET4, T3FREE, THYROIDAB in the last 72 hours. Anemia Panel: No results for input(s): VITAMINB12, FOLATE, FERRITIN, TIBC, IRON, RETICCTPCT in the last 72 hours. Urine analysis:    Component Value Date/Time   COLORURINE ORANGE (A) 05/24/2015 1645   APPEARANCEUR CLOUDY (A) 05/24/2015 1645   LABSPEC 1.023 05/24/2015 1645   PHURINE 5.5 05/24/2015 1645   GLUCOSEU NEGATIVE 05/24/2015 1645   HGBUR LARGE (A) 05/24/2015 1645   BILIRUBINUR MODERATE (A) 05/24/2015 1645   KETONESUR 40 (A) 05/24/2015 1645   PROTEINUR 100 (A) 05/24/2015 1645   NITRITE NEGATIVE 05/24/2015 1645   LEUKOCYTESUR SMALL (A) 05/24/2015 1645   Sepsis Labs: @LABRCNTIP (procalcitonin:4,lacticidven:4) )No results found for this or any previous visit (from the past 240 hour(s)).   Radiological Exams on Admission: Ct Head Wo Contrast  Result Date: 01/29/2016 CLINICAL DATA:  Dizziness and vomiting.  Right arm numbness EXAM: CT HEAD WITHOUT CONTRAST TECHNIQUE: Contiguous axial images were obtained from the base of the skull through the vertex without intravenous contrast. COMPARISON:  None available FINDINGS: Brain: No evidence of acute infarction, hemorrhage, hydrocephalus, extra-axial collection or mass lesion/mass effect. Generalized atrophy, mild for age. Small vessel ischemic  gliosis in the cerebral white matter, mild for age. Remote appearing small vessel infarcts in the bilateral cerebellum. Vascular: Diffuse atherosclerotic calcification. Skull: Negative Sinuses/Orbits: Left cataract resection.  No acute finding. IMPRESSION: 1. No acute finding. 2. Mild for age atrophy and chronic microvascular disease. Chronic appearing small vessel infarcts in the bilateral cerebellum. Electronically Signed   By: Marnee SpringJonathon  Watts M.D.   On: 01/29/2016 21:40   Mr Maxine GlennMra Head Wo Contrast  Result Date: 01/30/2016 CLINICAL DATA:  80 y/o M; history of stroke, complaining of ongoing generalized weakness since yesterday. EXAM: MRI HEAD WITHOUT CONTRAST MRA HEAD WITHOUT CONTRAST TECHNIQUE: Multiplanar, multiecho pulse sequences of the brain and surrounding structures were obtained without intravenous contrast. Angiographic images of the head were obtained using MRA technique without contrast. COMPARISON:  CT head dated 01/29/2016. FINDINGS: MRI HEAD FINDINGS Brain: Right anterior  paramedian pontine focus of diffusion restriction measuring 7 mm, series 5, image 15, consistent with acute/ early subacute infarction. There is associated T2 FLAIR hyperintensity. No abnormal susceptibility hypointensity to indicate intracranial hemorrhage. Moderate chronic microvascular ischemic changes and parenchymal volume loss. Chronic lacunar infarct within the left cerebellar hemisphere. Extra-axial space: No hydrocephalus. No midline shift. No effacement of basilar cisterns. No extra-axial collection is identified. Proximal intracranial flow voids are maintained. No abnormality of the cervical medullary junction. Other: Mild mucosal thickening of the anterior ethmoid air cells. No abnormal signal of the mastoid air cells. Left intra-ocular lens replacement. New Calvarium is unremarkable. MRA HEAD FINDINGS Internal carotid arteries: Mild irregularity bilaterally with short segments of mild stenosis. Anterior cerebral  arteries: Large right A1, small left A1, and large anterior communicating artery, normal variant. Middle cerebral arteries: Patent bilaterally. Short segment of mild stenosis of distal left M1 segment. Anterior communicating artery: Patent. Posterior communicating arteries: Left posterior communicating artery demonstrates an irregular flow voids in areas of narrowing, probably related to intracranial atherosclerosis. Posterior cerebral arteries: Diminished distal flow related signal left PCA of uncertain significance. Basilar artery:  Patent. Vertebral arteries: Patent left vertebral artery. No appreciable right vertebral artery, hypoplastic or occluded. Small proximal basilar signal void is probably a fenestration. The 3 mm left laterally directed mid basilar outpouching, series 6, image 91, may represent a aneurysm or dissection flap. Short segment of narrowing of the vessel downstream to the lesion at the level of infarction. IMPRESSION: 1. Mid basilar 3 mm outpouching may represent a small aneurysm or dissection flap. This can be further characterize with CT angiogram of the head. 2. Small focus of acute/early subacute infarction within the anterior right paramedian pons. 3. Areas of mild vessel irregularity and narrowing consistent with intracranial atherosclerosis. No large vessel occlusion. 4. Moderate chronic microvascular ischemic changes and parenchymal volume loss of the brain. Chronic left cerebellar lacunar infarct. 5. 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 These results were called by telephone at the time of interpretation on 01/30/2016 at 5:11 am to Dr. Ross Marcus , who verbally acknowledged these results. Electronically Signed   By: Mitzi Hansen M.D.   On: 01/30/2016 05:09   Mr Brain Wo Contrast  Result Date: 01/30/2016 CLINICAL DATA:  80 y/o M; history of stroke, complaining of ongoing generalized weakness since  yesterday. EXAM: MRI HEAD WITHOUT CONTRAST MRA HEAD WITHOUT CONTRAST TECHNIQUE: Multiplanar, multiecho pulse sequences of the brain and surrounding structures were obtained without intravenous contrast. Angiographic images of the head were obtained using MRA technique without contrast. COMPARISON:  CT head dated 01/29/2016. FINDINGS: MRI HEAD FINDINGS Brain: Right anterior paramedian pontine focus of diffusion restriction measuring 7 mm, series 5, image 15, consistent with acute/ early subacute infarction. There is associated T2 FLAIR hyperintensity. No abnormal susceptibility hypointensity to indicate intracranial hemorrhage. Moderate chronic microvascular ischemic changes and parenchymal volume loss. Chronic lacunar infarct within the left cerebellar hemisphere. Extra-axial space: No hydrocephalus. No midline shift. No effacement of basilar cisterns. No extra-axial collection is identified. Proximal intracranial flow voids are maintained. No abnormality of the cervical medullary junction. Other: Mild mucosal thickening of the anterior ethmoid air cells. No abnormal signal of the mastoid air cells. Left intra-ocular lens replacement. New Calvarium is unremarkable. MRA HEAD FINDINGS Internal carotid arteries: Mild irregularity bilaterally with short segments of mild stenosis. Anterior cerebral arteries: Large right A1, small left A1, and large anterior communicating artery, normal variant. Middle cerebral arteries: Patent bilaterally.  Short segment of mild stenosis of distal left M1 segment. Anterior communicating artery: Patent. Posterior communicating arteries: Left posterior communicating artery demonstrates an irregular flow voids in areas of narrowing, probably related to intracranial atherosclerosis. Posterior cerebral arteries: Diminished distal flow related signal left PCA of uncertain significance. Basilar artery:  Patent. Vertebral arteries: Patent left vertebral artery. No appreciable right vertebral  artery, hypoplastic or occluded. Small proximal basilar signal void is probably a fenestration. The 3 mm left laterally directed mid basilar outpouching, series 6, image 91, may represent a aneurysm or dissection flap. Short segment of narrowing of the vessel downstream to the lesion at the level of infarction. IMPRESSION: 1. Mid basilar 3 mm outpouching may represent a small aneurysm or dissection flap. This can be further characterize with CT angiogram of the head. 2. Small focus of acute/early subacute infarction within the anterior right paramedian pons. 3. Areas of mild vessel irregularity and narrowing consistent with intracranial atherosclerosis. No large vessel occlusion. 4. Moderate chronic microvascular ischemic changes and parenchymal volume loss of the brain. Chronic left cerebellar lacunar infarct. 5. 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 These results were called by telephone at the time of interpretation on 01/30/2016 at 5:11 am to Dr. Ross MarcusOURTNEY HORTON , who verbally acknowledged these results. Electronically Signed   By: Mitzi HansenLance  Furusawa-Stratton M.D.   On: 01/30/2016 05:09    EKG: Independently reviewed. Normal sinus rhythm with PVCs.  Assessment/Plan Principal Problem:   Right pontine stroke (HCC) Active Problems:   Stroke (cerebrum) (HCC)    1. Right pontine stroke - appreciate neurology consult. Did discuss with neurologist Dr. Roseanne RenoStewart. At this time since there is concern for basilar artery aneurysm versus dissection CT angiogram of the head and neck has been ordered. Patient is on aspirin. Get physical therapy consult. Check hemoglobin A1c and lipid panel. Check 2-D echo. Patient is placed on aspirin. 2. Elevated blood pressure - we'll allow for permissive hypertension. Closely monitor blood pressure trends. 3. Pancytopenia - cause not clear. Closely observe. Will need further workup as outpatient. 4. History of gout  presently not taking any medications.    DVT prophylaxis: SCDs pending CT angiogram of the head and neck. Code Status: Full code.  Family Communication: Discussed with patient.  Disposition Plan: Home.  Consults called: Neurology.  Admission status: Observation. Telemetry.    Eduard ClosKAKRAKANDY,Obelia Bonello N. MD [facility] Pager [phone].  If 7PM-7AM, please contact night-coverage [email or web address] Password [email or web address]  01/30/2016, 6:05 AM

## 2016-01-30 NOTE — Evaluation (Signed)
Physical Therapy Evaluation Patient Details Name: Trevor Bishop was admitted with onset of weakness  with numbness and slurred speech. PMH includes gout, previous stroke, and arthritis.  Clinical Impression  Pt admitted with the above and presents with the deficits listed below. Tolerated ambulation, however demonstrates balance deficits and decreased safety awareness requiring min guard. Pt required use of bilateral handrails for stairs and could benefit from additional stair training for safe return home due to lack of handrails. Lives with wife and has intermittent assistance from step-daughter. PT recommends HH PT at this time for safe return home, but pt may progress enough to recommend OP PT. Continue acute rehab to address strength, balance, gait training, and stair training.     Follow Up Recommendations Home health PT;Supervision - Intermittent    Equipment Recommendations  None recommended by PT    Recommendations for Other Services       Precautions / Restrictions Precautions Precautions: Fall Restrictions Weight Bearing Restrictions: No      Mobility  Bed Mobility Overal bed mobility: Needs Assistance Bed Mobility: Supine to Sit     Supine to sit: Supervision     General bed mobility comments: Pt requires increased time to move to EOB with HOB elevated  Transfers Overall transfer level: Needs assistance Equipment used: None Transfers: Sit to/from Stand Sit to Stand: Min guard         General transfer comment: Pt with exaggerated forward lean while powering up, requires increased time and min guard for safety.  Ambulation/Gait Ambulation/Gait assistance: Min guard Ambulation Distance (Feet): 50 Feet Assistive device: None Gait Pattern/deviations: Step-through pattern;Decreased stride length   Gait velocity interpretation: Below  normal speed for age/gender General Gait Details: Pt with decreased gait speed. No LOB noted, however min guard required for saftey.  Stairs Stairs: Yes Stairs assistance: Min guard Stair Management: Two rails;Alternating pattern Number of Stairs: 3 (+2 x 2) General stair comments: Pt required use of both hand rails for balance on stairs.   Wheelchair Mobility    Modified Rankin (Stroke Patients Only) Modified Rankin (Stroke Patients Only) Pre-Morbid Rankin Score: No symptoms Modified Rankin: Slight disability     Balance Overall balance assessment: Needs assistance Sitting-balance support: No upper extremity supported;Feet unsupported Sitting balance-Leahy Scale: Good Sitting balance - Comments: Pt able to doff and don sock on EOB.   Standing balance support: No upper extremity supported Standing balance-Leahy Scale: Fair                               Pertinent Vitals/Pain Pain Assessment: No/denies pain    Home Living Family/patient expects to be discharged to:: Private residence Living Arrangements: Spouse/significant other Available Help at Discharge: Family Type of Home: House Home Access: Stairs to enter Entrance Stairs-Rails: None (uses door frame to hold on to ) Entergy CorporationEntrance Stairs-Number of Steps: 1 Home Layout: One level Home Equipment: Walker - 2 wheels      Prior Function Level of Independence: Independent         Comments: Has RW that he used during episode of gout. Step-daughter occasionally provides assistance for driving.      Hand Dominance   Dominant Hand: Right    Extremity/Trunk Assessment   Upper Extremity Assessment: Defer to OT evaluation           Lower  Extremity Assessment: Generalized weakness         Communication   Communication: No difficulties  Cognition Arousal/Alertness: Awake/alert Behavior During Therapy: WFL for tasks assessed/performed Overall Cognitive Status: Impaired/Different from  baseline Area of Impairment: Safety/judgement         Safety/Judgement: Decreased awareness of safety          General Comments General comments (skin integrity, edema, etc.):  Pt able to don and doff sock with increased time required due to tightness of socks. Pt states he is feeling better today and is in good spirits.     Exercises        Assessment/Plan    PT Assessment Patient needs continued PT services  PT Diagnosis Abnormality of gait;Generalized weakness   PT Problem List Decreased strength;Decreased range of motion;Decreased balance;Decreased mobility  PT Treatment Interventions DME instruction;Gait training;Stair training;Functional mobility training;Therapeutic activities;Therapeutic exercise;Balance training;Neuromuscular re-education;Patient/family education   PT Goals (Current goals can be found in the Care Plan section) Acute Rehab PT Goals Patient Stated Goal: to go home soon PT Goal Formulation: With patient Time For Goal Achievement: 02/06/16 Potential to Achieve Goals: Good    Frequency Min 4X/week   Barriers to discharge        Co-evaluation               End of Session Equipment Utilized During Treatment: Gait belt Activity Tolerance: Patient tolerated treatment well Patient left: Other (comment) (taken for test)      Functional Assessment Tool Used: clinical judgement Functional Limitation: Mobility: Walking and moving around Mobility: Walking and Moving Around Current Status [phone](G8978): At least 1 percent but less than 20 percent impaired, limited or restricted Mobility: Walking and Moving Around Goal Status [phone](G8979): At least 1 percent but less than 20 percent impaired, limited or restricted    Time: 0849-0910 PT Time Calculation (min) (ACUTE ONLY): 21 min   Charges:   PT Evaluation $PT Eval Moderate Complexity: 1 Procedure     PT G Codes:   PT G-Codes **NOT FOR INPATIENT CLASS** Functional Assessment Tool Used: clinical  judgement Functional Limitation: Mobility: Walking and moving around Mobility: Walking and Moving Around Current Status ([id number]): At least 1 percent but less than 20 percent impaired, limited or restricted Mobility: Walking and Moving Around Goal Status [phone](G8979): At least 1 percent but less than 20 percent impaired, limited or restricted    Dreama SaaCara Francenia Chimenti 01/30/2016, 9:51 AM  [facility] A Kaelynn Igo, SPT (student physical therapist) Acute Rehabilitation Services [phone]

## 2016-01-30 NOTE — Progress Notes (Signed)
Patient arrived to 5M13 AAOx4. Vitals taken and tele placed. Will continue to monitor. Tyrianna Lightle, [place] ScrapeSarah E, RN

## 2016-01-30 NOTE — Consult Note (Signed)
Admission H&P    Chief Complaint: Weakness and numbness involving right side.  HPI: Trevor Bishop is an 80 y.o. male with a history of stroke as well as gout, presenting with complaint of onset of weakness at about 8:00 AM on 01/29/2016 along with numbness and slurred speech. Symptoms progressively lessened during the day and subsequently resolved. He has not been on antiplatelet therapy. CT scan of his head showed no acute findings. Old appearing cerebellar infarcts were noted. NIH stroke score was 0 at the time of this evaluation.  LSN: 8:00 AM on 01/29/2016 tPA Given: No: Deficits resolved mRankin:  Past Medical History:  Diagnosis Date  . Allergy   . Arthritis   . Gout   . Stroke [facility])     Past Surgical History:  Procedure Laterality Date  . EYE SURGERY      Family History  Problem Relation Age of Onset  . Gout Neg Hx   . Diabetes Mellitus II Neg Hx    Social History:  reports that he has never smoked. He has never used smokeless tobacco. He reports that he does not drink alcohol. His drug history is not on file.  Allergies: No Known Allergies  Medications: Preadmission medications were reviewed by me.  ROS: History obtained from chart review and the patient  General ROS: negative for - chills, fatigue, fever, night sweats, weight gain or weight loss Psychological ROS: negative for - behavioral disorder, hallucinations, memory difficulties, mood swings or suicidal ideation Ophthalmic ROS: negative for - blurry vision, double vision, eye pain or loss of vision ENT ROS: negative for - epistaxis, nasal discharge, oral lesions, sore throat, tinnitus or vertigo Allergy and Immunology ROS: negative for - hives or itchy/watery eyes Hematological and Lymphatic ROS: negative for - bleeding problems, bruising or swollen lymph nodes Endocrine ROS: negative for - galactorrhea, hair pattern changes, polydipsia/polyuria or temperature intolerance Respiratory ROS: negative for -  cough, hemoptysis, shortness of breath or wheezing Cardiovascular ROS: negative for - chest pain, dyspnea on exertion, edema or irregular heartbeat Gastrointestinal ROS: Positive for recent vomiting Genito-Urinary ROS: negative for - dysuria, hematuria, incontinence or urinary frequency/urgency Musculoskeletal ROS: negative for - joint swelling or muscular weakness Neurological ROS: as noted in HPI Dermatological ROS: negative for rash and skin lesion changes  Physical Examination: Blood pressure 153/60, pulse 73, temperature 97.9 F (36.6 C), temperature source Oral, resp. rate 16, SpO2 100 %.  HEENT-  Normocephalic, no lesions, without obvious abnormality.  Normal external eye and conjunctiva.  Normal TM's bilaterally.  Normal auditory canals and external ears. Normal external nose, mucus membranes and septum.  Normal pharynx. Neck supple with no masses, nodes, nodules or enlargement. Cardiovascular - regular rate and rhythm, S1, S2 normal, no murmur, click, rub or gallop Lungs - chest clear, no wheezing, rales, normal symmetric air entry Abdomen - soft, non-tender; bowel sounds normal; no masses,  no organomegaly Extremities - no joint deformities, effusion, or inflammation and no edema  Neurologic Examination: Mental Status: Alert, oriented, thought content appropriate.  Speech fluent without evidence of aphasia. Able to follow commands without difficulty. Cranial Nerves: II-Visual fields were normal. III/IV/VI-Pupils were equal and reacted normally to light. Extraocular movements were full and conjugate.    V/VII-no facial numbness and no facial weakness. VIII-normal. X-normal speech and symmetrical palatal movement. XI: trapezius strength/neck flexion strength normal bilaterally XII-midline tongue extension with normal strength. Motor: 5/5 bilaterally with normal tone and bulk Sensory: Normal throughout. Deep Tendon Reflexes: Absent throughout. Plantars: Flexor  on the left and  mute on the right. Cerebellar: Normal finger-to-nose testing. Carotid auscultation: Normal  Results for orders placed or performed during the hospital encounter of 01/30/16 (from the past 48 hour(s))  Protime-INR     Status: None   Collection Time: 01/29/16  9:08 PM  Result Value Ref Range   Prothrombin Time 14.5 11.4 - 15.2 seconds   INR 1.13   APTT     Status: None   Collection Time: 01/29/16  9:08 PM  Result Value Ref Range   aPTT 31 24 - 36 seconds  CBC     Status: Abnormal   Collection Time: 01/29/16  9:08 PM  Result Value Ref Range   WBC 3.1 (L) 4.0 - 10.5 K/uL   RBC 4.79 4.22 - 5.81 MIL/uL   Hemoglobin 12.4 (L) 13.0 - 17.0 g/dL   HCT 40.7 39.0 - 52.0 %   MCV 85.0 78.0 - 100.0 fL   MCH 25.9 (L) 26.0 - 34.0 pg   MCHC 30.5 30.0 - 36.0 g/dL   RDW 15.2 11.5 - 15.5 %   Platelets 135 (L) 150 - 400 K/uL  Differential     Status: None   Collection Time: 01/29/16  9:08 PM  Result Value Ref Range   Neutrophils Relative % 63 %   Neutro Abs 1.9 1.7 - 7.7 K/uL   Lymphocytes Relative 29 %   Lymphs Abs 0.9 0.7 - 4.0 K/uL   Monocytes Relative 8 %   Monocytes Absolute 0.3 0.1 - 1.0 K/uL   Eosinophils Relative 0 %   Eosinophils Absolute 0.0 0.0 - 0.7 K/uL   Basophils Relative 0 %   Basophils Absolute 0.0 0.0 - 0.1 K/uL  Comprehensive metabolic panel     Status: Abnormal   Collection Time: 01/29/16  9:08 PM  Result Value Ref Range   Sodium 141 135 - 145 mmol/L   Potassium 3.6 3.5 - 5.1 mmol/L   Chloride 109 101 - 111 mmol/L   CO2 27 22 - 32 mmol/L   Glucose, Bld 144 (H) 65 - 99 mg/dL   BUN 8 6 - 20 mg/dL   Creatinine, Ser 0.94 0.61 - 1.24 mg/dL   Calcium 9.2 8.9 - 10.3 mg/dL   Total Protein 6.9 6.5 - 8.1 g/dL   Albumin 4.0 3.5 - 5.0 g/dL   AST 37 15 - 41 U/L   ALT 32 17 - 63 U/L   Alkaline Phosphatase 63 38 - 126 U/L   Total Bilirubin 0.5 0.3 - 1.2 mg/dL   GFR calc non Af Amer >60 >60 mL/min   GFR calc Af Amer >60 >60 mL/min    Comment: (NOTE) The eGFR has been  calculated using the CKD EPI equation. This calculation has not been validated in all clinical situations. eGFR's persistently <60 mL/min signify possible Chronic Kidney Disease.    Anion gap 5 5 - 15  Ethanol     Status: None   Collection Time: 01/29/16  9:08 PM  Result Value Ref Range   Alcohol, Ethyl (B) <5 <5 mg/dL    Comment:        LOWEST DETECTABLE LIMIT FOR SERUM ALCOHOL IS 5 mg/dL FOR MEDICAL PURPOSES ONLY   I-stat troponin, ED     Status: None   Collection Time: 01/29/16  9:33 PM  Result Value Ref Range   Troponin i, poc 0.00 0.00 - 0.08 ng/mL   Comment 3            Comment: Due  to the release kinetics of cTnI, a negative result within the first hours of the onset of symptoms does not rule out myocardial infarction with certainty. If myocardial infarction is still suspected, repeat the test at appropriate intervals.   I-Stat Chem 8, ED     Status: Abnormal   Collection Time: 01/29/16  9:35 PM  Result Value Ref Range   Sodium 143 135 - 145 mmol/L   Potassium 3.6 3.5 - 5.1 mmol/L   Chloride 106 101 - 111 mmol/L   BUN 8 6 - 20 mg/dL   Creatinine, Ser 0.90 0.61 - 1.24 mg/dL   Glucose, Bld 142 (H) 65 - 99 mg/dL   Calcium, Ion 1.13 1.12 - 1.23 mmol/L   TCO2 27 0 - 100 mmol/L   Hemoglobin 13.3 13.0 - 17.0 g/dL   HCT 39.0 39.0 - 52.0 %   Ct Head Wo Contrast  Result Date: 01/29/2016 CLINICAL DATA:  Dizziness and vomiting.  Right arm numbness EXAM: CT HEAD WITHOUT CONTRAST TECHNIQUE: Contiguous axial images were obtained from the base of the skull through the vertex without intravenous contrast. COMPARISON:  None available FINDINGS: Brain: No evidence of acute infarction, hemorrhage, hydrocephalus, extra-axial collection or mass lesion/mass effect. Generalized atrophy, mild for age. Small vessel ischemic gliosis in the cerebral white matter, mild for age. Remote appearing small vessel infarcts in the bilateral cerebellum. Vascular: Diffuse atherosclerotic calcification.  Skull: Negative Sinuses/Orbits: Left cataract resection.  No acute finding. IMPRESSION: 1. No acute finding. 2. Mild for age atrophy and chronic microvascular disease. Chronic appearing small vessel infarcts in the bilateral cerebellum. Electronically Signed   By: Monte Fantasia M.D.   On: 01/29/2016 21:40    Assessment: 80 y.o. male with a history of previous stroke presenting with probable transient ischemic attack. However, a small subcortical left cerebral infarction cannot be ruled out at this point.  Stroke Risk Factors - family history  Plan: 1. HgbA1c, fasting lipid panel 2. MRI, MRA  of the brain without contrast 3. PT consult, OT consult, Speech consult 4. Echocardiogram 5. Carotid dopplers 6. Prophylactic therapy-Antiplatelet med: Aspirin  7. Risk factor modification 8. Telemetry monitoring  C.R. Nicole Kindred, MD [facility] [phone]  01/30/2016, 3:34 AM

## 2016-01-31 LAB — HEMOGLOBIN A1C
HEMOGLOBIN A1C: 5.7 % — AB (ref 4.8–5.6)
MEAN PLASMA GLUCOSE: 117 mg/dL

## 2016-01-31 NOTE — Progress Notes (Signed)
Physical Therapy Treatment Patient Details Name: Trevor Bishop MRN: [id number] DOB: 1936/04/28 Today's Date: 01/31/2016    History of Present Illness Trevor Bishop an 80 y.o.male was admitted with onset of weakness  with numbness and slurred speech. MRI (+) Small focus of acute/early subacute infarction within the PMH includes gout, previous stroke, and arthritis.    PT Comments    Pt progressing toward goals. Tolerated ambulation and stairs with single point cane with increased stability this session. Pt reports his wife is able to provide necessary supervision upon discharge. PT plan remains appropriate. Continue acute follow to address balance, endurance, and safety awareness.   Follow Up Recommendations  Home health PT;Supervision - Intermittent     Equipment Recommendations  None recommended by PT    Recommendations for Other Services       Precautions / Restrictions Precautions Precautions: Fall Restrictions Weight Bearing Restrictions: No    Mobility  Bed Mobility               General bed mobility comments: Pt found sitting in chair  Transfers Overall transfer level: Needs assistance Equipment used: Straight cane Transfers: Sit to/from Stand Sit to Stand: Min guard         General transfer comment: Pt attempted to power up x1 with cane right hand. Pt more successful pushing from chair with bilat UEs, and grabbing cane once standing.    Ambulation/Gait Ambulation/Gait assistance: Min guard Ambulation Distance (Feet): 100 Feet Assistive device: Straight cane Gait Pattern/deviations: Step-through pattern;Decreased stride length   Gait velocity interpretation: Below normal speed for age/gender General Gait Details: Pt more steady with single point cane and is agreeable to use for safety with ambulation.    Stairs   Stairs assistance: Min guard Stair Management: One rail Left;With cane Number of Stairs: 3 (+2 x2) General stair comments: Pt with  increased stability using cane and hand rail. Pt reports that he will be able to hold on to door frame to perform one stair at home, as no railing present.  Wheelchair Mobility    Modified Rankin (Stroke Patients Only) Modified Rankin (Stroke Patients Only) Pre-Morbid Rankin Score: No symptoms Modified Rankin: Moderate disability     Balance Overall balance assessment: Needs assistance Sitting-balance support: No upper extremity supported;Feet supported Sitting balance-Leahy Scale: Good     Standing balance support: During functional activity;Single extremity supported Standing balance-Leahy Scale: Fair           Rhomberg - Eyes Opened: 30 (sec) Rhomberg - Eyes Closed: 25 (sec before needing to open eyes)        Cognition Arousal/Alertness: Awake/alert Behavior During Therapy: WFL for tasks assessed/performed Overall Cognitive Status: Impaired/Different from baseline Area of Impairment: Safety/judgement         Safety/Judgement: Decreased awareness of safety     General Comments: Pt with decreased awareness of safety with  balance deficits.    Exercises      General Comments General comments (skin integrity, edema, etc.): Pt reports feeling more steady with cane this session.       Pertinent Vitals/Pain Pain Assessment: No/denies pain    Home Living Family/patient expects to be discharged to:: Private residence Living Arrangements: Spouse/significant other Available Help at Discharge: Family Type of Home: House Home Access: Stairs to enter Entrance Stairs-Rails: None Home Layout: One level Home Equipment: Environmental consultantWalker - 2 wheels Additional Comments:   Prior Function Level of Independence: Independent      Comments: Has RW that he used during episode  of gout. Step-daughter occasionally provides assistance for driving.    PT Goals (current goals can now be found in the care plan section) Acute Rehab PT Goals Patient Stated Goal: to go home today PT Goal  Formulation: With patient Time For Goal Achievement: 02/06/16 Potential to Achieve Goals: Good Progress towards PT goals: Progressing toward goals    Frequency  Min 4X/week    PT Plan Current plan remains appropriate    Co-evaluation             End of Session Equipment Utilized During Treatment: Gait belt Activity Tolerance: Patient tolerated treatment well Patient left: in chair;with call bell/phone within reach;with chair alarm set     Time: 0102-72531505-1526 PT Time Calculation (min) (ACUTE ONLY): 21 min  Charges:  $Gait Training: 8-22 mins                    G CodesDreama Saa:      Jaislyn Blinn 01/31/2016, 3:55 PM  [facility] A Jakelyn Squyres, SPT (student physical therapist) Acute Rehabilitation Services [phone]

## 2016-01-31 NOTE — Progress Notes (Signed)
PROGRESS NOTE  Trevor Bishop  JXB:[id number] DOB: Oct 04, 1935  DOA: 01/30/2016 PCP: Lolita PatellaEADE,ROBERT ALEXANDER, MD   Brief Narrative:  [name] a 80 y.o.malewith gout and previous stroke presents to the ER because of dizziness, found on MRI to have anterior right paramedian pons infarction.   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 Left ICA 66% stenosis. Small pontine infarct remains or cord. - MRI and MRA: Mild basilar 3 mm outpouching, possibly small aneurysm or dissection flap. Acute/subacute infarction anterior right paramedian pons. No large vessel occlusion. Small vessel disease. Old left cerebellar lacunar - 2-D echo: LVEF 65-70 percent. Severe LVH. No source of embolus. - LDL: 57 - A1c: 5.7. - No antithrombotic prior to admission, now on aspirin 325 MG daily and Plavix 75 MG daily given basilar artery stenosis. Continue dual antiplatelets 3 months then Plavix alone. - Therapies recommend home health PT. - Neurology consultation and follow-up appreciated. Outpatient follow-up with neurology in 2 months.  Essential hypertension - Controlled  Hyperlipidemia - LDL 57, goal <70. Continue lavages at discharge.  History of gout - No acute flare.  Pancytopenia Unclear etiology. Outpatient evaluation as deemed necessary. Thrombocytopenia seems chronic.   DVT prophylaxis: Lovenox Code Status: Full Family Communication: Discussed with patient. No family at bedside. Disposition Plan: DC home 8/24   Consultants:   Neurology  Procedures:   None  Antimicrobials:   None    Subjective: States that he feels better. Denies asymmetric limb weakness.  Occasional dizziness.   Objective:  Vitals:   01/31/16 0700 01/31/16 1100 01/31/16 1345 01/31/16 1742  BP: (!) 143/78 132/70 (!) 124/55 136/68  Pulse: 75 82 89 79  Resp: 18 16 20 20   Temp: 98.3 F (36.8 C) 98.2 F (36.8 C) 99 F (37.2 C) 97.7 F (36.5 C)  TempSrc: Oral Oral Oral Oral  SpO2: 100% 98% 96% 99%  Weight:      Height:        Intake/Output Summary (Last 24 hours) at 01/31/16 1941 Last data filed at 01/31/16 1821  Gross per 24 hour  Intake          1601.25 ml  Output             2000 ml  Net          -398.75 ml   Filed Weights   01/30/16 0626  Weight: 86.1 kg (189 lb 12.8 oz)    Examination:  General exam: Pleasant elderly male lying comfortably in bed Respiratory system: Clear to auscultation. Respiratory effort normal. Cardiovascular system: S1 & S2 heard, RRR. No JVD, murmurs, rubs, gallops or clicks. No pedal edema. Telemetry: Sinus rhythm with occasional PVCs and trigeminy night. Gastrointestinal system: Abdomen is nondistended, soft and nontender. No organomegaly or masses felt. Normal bowel sounds heard. Central nervous system: Alert and oriented. No focal neurological deficits. Extremities: Symmetric 5 x 5 power. Skin: No rashes, lesions or ulcers Psychiatry: Judgement and insight appear normal. Mood & affect appropriate.     Data Reviewed: I have personally reviewed following labs and imaging studies  CBC:  Recent Labs Lab 01/29/16 2108 01/29/16 2135 01/30/16 0657  WBC 3.1*  --  2.6*  NEUTROABS 1.9  --   --  HGB 12.4* 13.3 12.5*  HCT 40.7 39.0 41.3  MCV 85.0  --  85.2  PLT 135*  --  121*   Basic Metabolic Panel:  Recent Labs Lab 01/29/16 2108 01/29/16 2135 01/30/16 0657  NA 141 143 142  K 3.6 3.6 3.6  CL 109 106 107  CO2 27  --  28  GLUCOSE 144* 142* 123*  BUN 8 8 8   CREATININE 0.94 0.90 0.93  CALCIUM 9.2  --  9.1   GFR: Estimated Creatinine Clearance: 75.2 mL/min (by C-G formula based on SCr of 0.93 mg/dL). Liver  Function Tests:  Recent Labs Lab 01/29/16 2108 01/30/16 0657  AST 37 25  ALT 32 27  ALKPHOS 63 55  BILITOT 0.5 0.5  PROT 6.9 6.4*  ALBUMIN 4.0 3.7   No results for input(s): LIPASE, AMYLASE in the last 168 hours. No results for input(s): AMMONIA in the last 168 hours. Coagulation Profile:  Recent Labs Lab 01/29/16 2108  INR 1.13   Cardiac Enzymes: No results for input(s): CKTOTAL, CKMB, CKMBINDEX, TROPONINI in the last 168 hours. BNP (last 3 results) No results for input(s): PROBNP in the last 8760 hours. HbA1C:  Recent Labs  01/30/16 0656  HGBA1C 5.7*   CBG: No results for input(s): GLUCAP in the last 168 hours. Lipid Profile:  Recent Labs  01/30/16 0657  CHOL 102  HDL 32*  LDLCALC 57  TRIG 66  CHOLHDL 3.2   Thyroid Function Tests: No results for input(s): TSH, T4TOTAL, FREET4, T3FREE, THYROIDAB in the last 72 hours. Anemia Panel: No results for input(s): VITAMINB12, FOLATE, FERRITIN, TIBC, IRON, RETICCTPCT in the last 72 hours.  Sepsis Labs: No results for input(s): PROCALCITON, LATICACIDVEN in the last 168 hours.  No results found for this or any previous visit (from the past 240 hour(s)).       Radiology Studies: Ct Angio Head W Or Wo Contrast  Addendum Date: 01/30/2016   ADDENDUM REPORT: 01/30/2016 08:59 ADDENDUM: Study discussed by telephone with Dr. Erenest Blank On 01/30/2016 at 0852 hours. We discussed that I favor the distal right vertebral artery and left PCA occlusions are chronic in light of no acute ischemia in those vascular territories on the MRI earlier today. Electronically Signed   By: Odessa Fleming M.D.   On: 01/30/2016 08:59   Result Date: 01/30/2016 CLINICAL DATA:  80 year old male with right para median pontine lacunar infarct. 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 Initial encounter. EXAM: CT ANGIOGRAPHY HEAD AND NECK TECHNIQUE: Multidetector CT imaging of the head and neck was performed using the standard  protocol during bolus administration of intravenous contrast. Multiplanar CT image reconstructions and MIPs were obtained to evaluate the vascular anatomy. Carotid stenosis measurements (when applicable) are obtained utilizing NASCET criteria, using the distal internal carotid diameter as the denominator. CONTRAST:  50 mL Isovue 370 COMPARISON:  Brain MRI and MRA 01/30/2016. Head CT without contrast 01/29/2016. FINDINGS: CTA NECK Skeleton: Degenerative changes in the cervical spine. No acute osseous abnormality identified. Absent dentition Visualized paranasal sinuses and mastoids are stable and well pneumatized. Other neck: Partially visible cardiomegaly with coronary artery calcified atherosclerosis. Mild dependent opacity in the upper lungs compatible with atelectasis. No superior mediastinal lymphadenopathy. Negative thyroid, larynx (glottis is closed), pharynx, parapharyngeal spaces, retropharyngeal space, sublingual space, submandibular glands and parotid glands. Cervical lymph nodes are within normal limits. Aortic arch: 3 vessel arch configuration. Widespread soft atherosclerotic plaque in the arch. Mild calcified plaque. Right carotid system: Tortuous brachiocephalic artery  and proximal right CCA with no stenosis. Mild calcified plaque at the right carotid bifurcation cava no stenosis. Mild calcified plaque of the right CCA just below the skullbase with no stenosis. Left carotid system: No left CCA origin stenosis despite soft and calcified plaque. Tortuous proximal left CCA. Bulky combined soft and calcified plaque at the left carotid bifurcation mostly affecting the left ICA origin and proximal bulb. Subsequent high-grade stenosis numerically estimated at up to 66 % with respect to the distal vessel. Distal to the bulb there is only calcified plaque in the distal cervical left ICA just below the skullbase, with no associated stenosis. Vertebral arteries: No proximal right subclavian artery stenosis.  Calcified plaque at the right vertebral artery origin with moderate stenosis (series 609, image 126). The right vertebral is non dominant and diminutive throughout the neck. It occludes in the right V3 segment at the C1 vertebral level on series 5, image 82. No proximal left subclavian stenosis. Normal left vertebral artery origin. The left V1 segment is somewhat dolichoectatic. The left vertebral is dominant, and widely patent to the skullbase. CTA HEAD Posterior circulation: The right V4 segment is occluded with no reconstituted flow. There is calcified plaque in the dominant left V4 segment with mild stenosis (series 609, image 126). The left vertebral supplies the basilar. Mild calcified plaque at the vertebrobasilar junction with mild stenosis. Right AICA origin is patent. The basilar artery is irregular, and there is a moderate to severe mid basilar stenosis (series 610, image 262 and series 609, image 104). This is at the level of the pons. The left lateral contour deformity of the vessel seen by MRA is re- demonstrated on series 609, image 105. There is no associated dissection, and this is not a saccular aneurysm. The distal basilar has a more normal caliber. The SCA and right PCA origins are normal. Posterior communicating arteries are diminutive or absent. Moderate to severe tandem stenoses of the proximal left PCA at its origin and in the P1 segment. The right P1 and P2 are normal. There is moderate irregularity of the right P3 branches. The left PCA is occluded in the distal P2 segment. Anterior circulation: Both ICA siphons are patent. There is moderate siphon calcified plaque bilaterally but no significant stenosis. Normal ophthalmic artery origins. Patent carotid termini. Normal MCA and ACA origins. The right A1 is dominant, the left is diminutive. Anterior communicating artery and bilateral ACA branches are normal. Left MCA M1 segment is tortuous. There is mild irregularity and stenosis in the  distal M1. Left MCA bifurcation and branches are normal. Right MCA M1 segment, bifurcation, and right MCA branches are normal. Venous sinuses: Patent. Anatomic variants: Dominant left vertebral artery. Dominant right ACA A1 segment. Delayed phase: Stable supratentorial gray-white matter differentiation. The small pontine lacune remains occult by CT. No abnormal enhancement identified. IMPRESSION: 1. Severe posterior circulation atherosclerosis. Age indeterminate occlusions of the non dominant distal Right vertebral artery (V3 and V4 segments), and distal Left PCA (distal P2 and P3 segments). Otherwise no emergent large vessel occlusion. 2. Superimposed severe mid basilar stenosis, at the level of the pons. Severe stenosis of the proximal left PCA. 3. Small mid basilar contour deformity seen on recent MRA is felt related to atherosclerosis. No basilar artery dissection or aneurysm. 4. Atherosclerosis of the left carotid bifurcation with High-grade (66%) stenosis of the Left ICA origin. 5. Elsewhere calcified carotid atherosclerosis without associated stenosis. Mild for age anterior circulation atherosclerosis. 6. Stable CT appearance of the brain. Small  pontine infarct remains occult by CT. 7. Cardiomegaly.  Calcified coronary artery atherosclerosis. Electronically Signed: By: Odessa Fleming M.D. On: 01/30/2016 08:34   Ct Head Wo Contrast  Result Date: 01/29/2016 CLINICAL DATA:  Dizziness and vomiting.  Right arm numbness EXAM: CT HEAD WITHOUT CONTRAST TECHNIQUE: Contiguous axial images were obtained from the base of the skull through the vertex without intravenous contrast. COMPARISON:  None available FINDINGS: Brain: No evidence of acute infarction, hemorrhage, hydrocephalus, extra-axial collection or mass lesion/mass effect. Generalized atrophy, mild for age. Small vessel ischemic gliosis in the cerebral white matter, mild for age. Remote appearing small vessel infarcts in the bilateral cerebellum. Vascular: Diffuse  atherosclerotic calcification. Skull: Negative Sinuses/Orbits: Left cataract resection.  No acute finding. IMPRESSION: 1. No acute finding. 2. Mild for age atrophy and chronic microvascular disease. Chronic appearing small vessel infarcts in the bilateral cerebellum. Electronically Signed   By: Marnee Spring M.D.   On: 01/29/2016 21:40   Ct Angio Neck W And/or Wo Contrast  Addendum Date: 01/30/2016   ADDENDUM REPORT: 01/30/2016 08:59 ADDENDUM: Study discussed by telephone with Dr. Erenest Blank On 01/30/2016 at 0852 hours. We discussed that I favor the distal right vertebral artery and left PCA occlusions are chronic in light of no acute ischemia in those vascular territories on the MRI earlier today. Electronically Signed   By: Odessa Fleming M.D.   On: 01/30/2016 08:59   Result Date: 01/30/2016 CLINICAL DATA:  80 year old male with right para median pontine lacunar infarct. 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 Initial encounter. EXAM: CT ANGIOGRAPHY HEAD AND NECK TECHNIQUE: Multidetector CT imaging of the head and neck was performed using the standard protocol during bolus administration of intravenous contrast. Multiplanar CT image reconstructions and MIPs were obtained to evaluate the vascular anatomy. Carotid stenosis measurements (when applicable) are obtained utilizing NASCET criteria, using the distal internal carotid diameter as the denominator. CONTRAST:  50 mL Isovue 370 COMPARISON:  Brain MRI and MRA 01/30/2016. Head CT without contrast 01/29/2016. FINDINGS: CTA NECK Skeleton: Degenerative changes in the cervical spine. No acute osseous abnormality identified. Absent dentition Visualized paranasal sinuses and mastoids are stable and well pneumatized. Other neck: Partially visible cardiomegaly with coronary artery calcified atherosclerosis. Mild dependent opacity in the upper lungs compatible with atelectasis. No superior mediastinal lymphadenopathy. Negative thyroid, larynx  (glottis is closed), pharynx, parapharyngeal spaces, retropharyngeal space, sublingual space, submandibular glands and parotid glands. Cervical lymph nodes are within normal limits. Aortic arch: 3 vessel arch configuration. Widespread soft atherosclerotic plaque in the arch. Mild calcified plaque. Right carotid system: Tortuous brachiocephalic artery and proximal right CCA with no stenosis. Mild calcified plaque at the right carotid bifurcation cava no stenosis. Mild calcified plaque of the right CCA just below the skullbase with no stenosis. Left carotid system: No left CCA origin stenosis despite soft and calcified plaque. Tortuous proximal left CCA. Bulky combined soft and calcified plaque at the left carotid bifurcation mostly affecting the left ICA origin and proximal bulb. Subsequent high-grade stenosis numerically estimated at up to 66 % with respect to the distal vessel. Distal to the bulb there is only calcified plaque in the distal cervical left ICA just below the skullbase, with no associated stenosis. Vertebral arteries: No proximal right subclavian artery stenosis. Calcified plaque at the right vertebral artery origin with moderate stenosis (series 609, image 126). The right vertebral is non dominant and diminutive throughout the neck. It occludes in the right V3 segment at the C1 vertebral level  on series 5, image 82. No proximal left subclavian stenosis. Normal left vertebral artery origin. The left V1 segment is somewhat dolichoectatic. The left vertebral is dominant, and widely patent to the skullbase. CTA HEAD Posterior circulation: The right V4 segment is occluded with no reconstituted flow. There is calcified plaque in the dominant left V4 segment with mild stenosis (series 609, image 126). The left vertebral supplies the basilar. Mild calcified plaque at the vertebrobasilar junction with mild stenosis. Right AICA origin is patent. The basilar artery is irregular, and there is a moderate to  severe mid basilar stenosis (series 610, image 262 and series 609, image 104). This is at the level of the pons. The left lateral contour deformity of the vessel seen by MRA is re- demonstrated on series 609, image 105. There is no associated dissection, and this is not a saccular aneurysm. The distal basilar has a more normal caliber. The SCA and right PCA origins are normal. Posterior communicating arteries are diminutive or absent. Moderate to severe tandem stenoses of the proximal left PCA at its origin and in the P1 segment. The right P1 and P2 are normal. There is moderate irregularity of the right P3 branches. The left PCA is occluded in the distal P2 segment. Anterior circulation: Both ICA siphons are patent. There is moderate siphon calcified plaque bilaterally but no significant stenosis. Normal ophthalmic artery origins. Patent carotid termini. Normal MCA and ACA origins. The right A1 is dominant, the left is diminutive. Anterior communicating artery and bilateral ACA branches are normal. Left MCA M1 segment is tortuous. There is mild irregularity and stenosis in the distal M1. Left MCA bifurcation and branches are normal. Right MCA M1 segment, bifurcation, and right MCA branches are normal. Venous sinuses: Patent. Anatomic variants: Dominant left vertebral artery. Dominant right ACA A1 segment. Delayed phase: Stable supratentorial gray-white matter differentiation. The small pontine lacune remains occult by CT. No abnormal enhancement identified. IMPRESSION: 1. Severe posterior circulation atherosclerosis. Age indeterminate occlusions of the non dominant distal Right vertebral artery (V3 and V4 segments), and distal Left PCA (distal P2 and P3 segments). Otherwise no emergent large vessel occlusion. 2. Superimposed severe mid basilar stenosis, at the level of the pons. Severe stenosis of the proximal left PCA. 3. Small mid basilar contour deformity seen on recent MRA is felt related to atherosclerosis. No  basilar artery dissection or aneurysm. 4. Atherosclerosis of the left carotid bifurcation with High-grade (66%) stenosis of the Left ICA origin. 5. Elsewhere calcified carotid atherosclerosis without associated stenosis. Mild for age anterior circulation atherosclerosis. 6. Stable CT appearance of the brain. Small pontine infarct remains occult by CT. 7. Cardiomegaly.  Calcified coronary artery atherosclerosis. Electronically Signed: By: Odessa FlemingH  Hall M.D. On: 01/30/2016 08:34   Mr Trevor GlennMra Head Wo Contrast  Result Date: 01/30/2016 CLINICAL DATA:  80 y/o M; history of stroke, complaining of ongoing generalized weakness since yesterday. EXAM: MRI HEAD WITHOUT CONTRAST MRA HEAD WITHOUT CONTRAST TECHNIQUE: Multiplanar, multiecho pulse sequences of the brain and surrounding structures were obtained without intravenous contrast. Angiographic images of the head were obtained using MRA technique without contrast. COMPARISON:  CT head dated 01/29/2016. FINDINGS: MRI HEAD FINDINGS Brain: Right anterior paramedian pontine focus of diffusion restriction measuring 7 mm, series 5, image 15, consistent with acute/ early subacute infarction. There is associated T2 FLAIR hyperintensity. No abnormal susceptibility hypointensity to indicate intracranial hemorrhage. Moderate chronic microvascular ischemic changes and parenchymal volume loss. Chronic lacunar infarct within the left cerebellar hemisphere. Extra-axial space: No hydrocephalus.  No midline shift. No effacement of basilar cisterns. No extra-axial collection is identified. Proximal intracranial flow voids are maintained. No abnormality of the cervical medullary junction. Other: Mild mucosal thickening of the anterior ethmoid air cells. No abnormal signal of the mastoid air cells. Left intra-ocular lens replacement. New Calvarium is unremarkable. MRA HEAD FINDINGS Internal carotid arteries: Mild irregularity bilaterally with short segments of mild stenosis. Anterior cerebral  arteries: Large right A1, small left A1, and large anterior communicating artery, normal variant. Middle cerebral arteries: Patent bilaterally. Short segment of mild stenosis of distal left M1 segment. Anterior communicating artery: Patent. Posterior communicating arteries: Left posterior communicating artery demonstrates an irregular flow voids in areas of narrowing, probably related to intracranial atherosclerosis. Posterior cerebral arteries: Diminished distal flow related signal left PCA of uncertain significance. Basilar artery:  Patent. Vertebral arteries: Patent left vertebral artery. No appreciable right vertebral artery, hypoplastic or occluded. Small proximal basilar signal void is probably a fenestration. The 3 mm left laterally directed mid basilar outpouching, series 6, image 91, may represent a aneurysm or dissection flap. Short segment of narrowing of the vessel downstream to the lesion at the level of infarction. IMPRESSION: 1. Mid basilar 3 mm outpouching may represent a small aneurysm or dissection flap. This can be further characterize with CT angiogram of the head. 2. Small focus of acute/early subacute infarction within the anterior right paramedian pons. 3. Areas of mild vessel irregularity and narrowing consistent with intracranial atherosclerosis. No large vessel occlusion. 4. Moderate chronic microvascular ischemic changes and parenchymal volume loss of the brain. Chronic left cerebellar lacunar infarct. 5. 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 These results were called by telephone at the time of interpretation on 01/30/2016 at 5:11 am to Dr. Ross Marcus , who verbally acknowledged these results. Electronically Signed   By: Mitzi Hansen M.D.   On: 01/30/2016 05:09   Mr Brain Wo Contrast  Result Date: 01/30/2016 CLINICAL DATA:  80 y/o M; history of stroke, complaining of ongoing generalized weakness since  yesterday. EXAM: MRI HEAD WITHOUT CONTRAST MRA HEAD WITHOUT CONTRAST TECHNIQUE: Multiplanar, multiecho pulse sequences of the brain and surrounding structures were obtained without intravenous contrast. Angiographic images of the head were obtained using MRA technique without contrast. COMPARISON:  CT head dated 01/29/2016. FINDINGS: MRI HEAD FINDINGS Brain: Right anterior paramedian pontine focus of diffusion restriction measuring 7 mm, series 5, image 15, consistent with acute/ early subacute infarction. There is associated T2 FLAIR hyperintensity. No abnormal susceptibility hypointensity to indicate intracranial hemorrhage. Moderate chronic microvascular ischemic changes and parenchymal volume loss. Chronic lacunar infarct within the left cerebellar hemisphere. Extra-axial space: No hydrocephalus. No midline shift. No effacement of basilar cisterns. No extra-axial collection is identified. Proximal intracranial flow voids are maintained. No abnormality of the cervical medullary junction. Other: Mild mucosal thickening of the anterior ethmoid air cells. No abnormal signal of the mastoid air cells. Left intra-ocular lens replacement. New Calvarium is unremarkable. MRA HEAD FINDINGS Internal carotid arteries: Mild irregularity bilaterally with short segments of mild stenosis. Anterior cerebral arteries: Large right A1, small left A1, and large anterior communicating artery, normal variant. Middle cerebral arteries: Patent bilaterally. Short segment of mild stenosis of distal left M1 segment. Anterior communicating artery: Patent. Posterior communicating arteries: Left posterior communicating artery demonstrates an irregular flow voids in areas of narrowing, probably related to intracranial atherosclerosis. Posterior cerebral arteries: Diminished distal flow related signal left PCA of uncertain significance. Basilar artery:  Patent. Vertebral arteries:  Patent left vertebral artery. No appreciable right vertebral  artery, hypoplastic or occluded. Small proximal basilar signal void is probably a fenestration. The 3 mm left laterally directed mid basilar outpouching, series 6, image 91, may represent a aneurysm or dissection flap. Short segment of narrowing of the vessel downstream to the lesion at the level of infarction. IMPRESSION: 1. Mid basilar 3 mm outpouching may represent a small aneurysm or dissection flap. This can be further characterize with CT angiogram of the head. 2. Small focus of acute/early subacute infarction within the anterior right paramedian pons. 3. Areas of mild vessel irregularity and narrowing consistent with intracranial atherosclerosis. No large vessel occlusion. 4. Moderate chronic microvascular ischemic changes and parenchymal volume loss of the brain. Chronic left cerebellar lacunar infarct. 5. 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 These results were called by telephone at the time of interpretation on 01/30/2016 at 5:11 am to Dr. Ross Marcus , who verbally acknowledged these results. Electronically Signed   By: Mitzi Hansen M.D.   On: 01/30/2016 05:09        Scheduled Meds: . aspirin  300 mg Rectal Daily   Or  . aspirin  325 mg Oral Daily  . clopidogrel  75 mg Oral Daily  . enoxaparin (LOVENOX) injection  40 mg Subcutaneous Q24H  . omega-3 acid ethyl esters  1 g Oral Daily   Continuous Infusions:    LOS: 1 day    Time spent: 25 minutes.    [facility], MD [facility] Pager [phone]  If 7PM-7AM, please contact night-coverage [email or web address] Password [facility] 01/31/2016, 7:41 PM

## 2016-01-31 NOTE — Progress Notes (Signed)
STROKE TEAM PROGRESS NOTE   SUBJECTIVE (INTERVAL HISTORY) Patient lying in the bed, talking on the phone. No new complaints.    OBJECTIVE Temp:  [98.3 F (36.8 C)-99.3 F (37.4 C)] 98.3 F (36.8 C) (08/23 0700) Pulse Rate:  [67-86] 75 (08/23 0700) Cardiac Rhythm: Heart block (08/22 1901) Resp:  [18-20] 18 (08/23 0700) BP: (133-181)/(67-86) 143/78 (08/23 0700) SpO2:  [97 %-100 %] 100 % (08/23 0700)  CBC:   Recent Labs Lab 01/29/16 2108 01/29/16 2135 01/30/16 0657  WBC 3.1*  --  2.6*  NEUTROABS 1.9  --   --   HGB 12.4* 13.3 12.5*  HCT 40.7 39.0 41.3  MCV 85.0  --  85.2  PLT 135*  --  121*    Basic Metabolic Panel:   Recent Labs Lab 01/29/16 2108 01/29/16 2135 01/30/16 0657  NA 141 143 142  K 3.6 3.6 3.6  CL 109 106 107  CO2 27  --  28  GLUCOSE 144* 142* 123*  BUN 8 8 8   CREATININE 0.94 0.90 0.93  CALCIUM 9.2  --  9.1    Lipid Panel:     Component Value Date/Time   CHOL 102 01/30/2016 0657   TRIG 66 01/30/2016 0657   HDL 32 (L) 01/30/2016 0657   CHOLHDL 3.2 01/30/2016 0657   VLDL 13 01/30/2016 0657   LDLCALC 57 01/30/2016 0657   HgbA1c:  Lab Results  Component Value Date   HGBA1C 5.7 (H) 01/30/2016   Urine Drug Screen: No results found for: LABOPIA, COCAINSCRNUR, LABBENZ, AMPHETMU, THCU, LABBARB    IMAGING  Ct Angio Head W Or Wo Contrast  Addendum Date: 01/30/2016   ADDENDUM REPORT: 01/30/2016 08:59 ADDENDUM: Study discussed by telephone with Dr. Erenest Blank On 01/30/2016 at 0852 hours. We discussed that I favor the distal right vertebral artery and left PCA occlusions are chronic in light of no acute ischemia in those vascular territories on the MRI earlier today. Electronically Signed   By: Odessa Fleming M.D.   On: 01/30/2016 08:59   Result Date: 01/30/2016 CLINICAL DATA:  80 year old male with right para median pontine lacunar infarct. 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 Initial encounter. EXAM: CT ANGIOGRAPHY HEAD AND  NECK TECHNIQUE: Multidetector CT imaging of the head and neck was performed using the standard protocol during bolus administration of intravenous contrast. Multiplanar CT image reconstructions and MIPs were obtained to evaluate the vascular anatomy. Carotid stenosis measurements (when applicable) are obtained utilizing NASCET criteria, using the distal internal carotid diameter as the denominator. CONTRAST:  50 mL Isovue 370 COMPARISON:  Brain MRI and MRA 01/30/2016. Head CT without contrast 01/29/2016. FINDINGS: CTA NECK Skeleton: Degenerative changes in the cervical spine. No acute osseous abnormality identified. Absent dentition Visualized paranasal sinuses and mastoids are stable and well pneumatized. Other neck: Partially visible cardiomegaly with coronary artery calcified atherosclerosis. Mild dependent opacity in the upper lungs compatible with atelectasis. No superior mediastinal lymphadenopathy. Negative thyroid, larynx (glottis is closed), pharynx, parapharyngeal spaces, retropharyngeal space, sublingual space, submandibular glands and parotid glands. Cervical lymph nodes are within normal limits. Aortic arch: 3 vessel arch configuration. Widespread soft atherosclerotic plaque in the arch. Mild calcified plaque. Right carotid system: Tortuous brachiocephalic artery and proximal right CCA with no stenosis. Mild calcified plaque at the right carotid bifurcation cava no stenosis. Mild calcified plaque of the right CCA just below the skullbase with no stenosis. Left carotid system: No left CCA origin stenosis despite soft and calcified plaque. Tortuous proximal  left CCA. Bulky combined soft and calcified plaque at the left carotid bifurcation mostly affecting the left ICA origin and proximal bulb. Subsequent high-grade stenosis numerically estimated at up to 66 % with respect to the distal vessel. Distal to the bulb there is only calcified plaque in the distal cervical left ICA just below the skullbase, with  no associated stenosis. Vertebral arteries: No proximal right subclavian artery stenosis. Calcified plaque at the right vertebral artery origin with moderate stenosis (series 609, image 126). The right vertebral is non dominant and diminutive throughout the neck. It occludes in the right V3 segment at the C1 vertebral level on series 5, image 82. No proximal left subclavian stenosis. Normal left vertebral artery origin. The left V1 segment is somewhat dolichoectatic. The left vertebral is dominant, and widely patent to the skullbase. CTA HEAD Posterior circulation: The right V4 segment is occluded with no reconstituted flow. There is calcified plaque in the dominant left V4 segment with mild stenosis (series 609, image 126). The left vertebral supplies the basilar. Mild calcified plaque at the vertebrobasilar junction with mild stenosis. Right AICA origin is patent. The basilar artery is irregular, and there is a moderate to severe mid basilar stenosis (series 610, image 262 and series 609, image 104). This is at the level of the pons. The left lateral contour deformity of the vessel seen by MRA is re- demonstrated on series 609, image 105. There is no associated dissection, and this is not a saccular aneurysm. The distal basilar has a more normal caliber. The SCA and right PCA origins are normal. Posterior communicating arteries are diminutive or absent. Moderate to severe tandem stenoses of the proximal left PCA at its origin and in the P1 segment. The right P1 and P2 are normal. There is moderate irregularity of the right P3 branches. The left PCA is occluded in the distal P2 segment. Anterior circulation: Both ICA siphons are patent. There is moderate siphon calcified plaque bilaterally but no significant stenosis. Normal ophthalmic artery origins. Patent carotid termini. Normal MCA and ACA origins. The right A1 is dominant, the left is diminutive. Anterior communicating artery and bilateral ACA branches are  normal. Left MCA M1 segment is tortuous. There is mild irregularity and stenosis in the distal M1. Left MCA bifurcation and branches are normal. Right MCA M1 segment, bifurcation, and right MCA branches are normal. Venous sinuses: Patent. Anatomic variants: Dominant left vertebral artery. Dominant right ACA A1 segment. Delayed phase: Stable supratentorial gray-white matter differentiation. The small pontine lacune remains occult by CT. No abnormal enhancement identified. IMPRESSION: 1. Severe posterior circulation atherosclerosis. Age indeterminate occlusions of the non dominant distal Right vertebral artery (V3 and V4 segments), and distal Left PCA (distal P2 and P3 segments). Otherwise no emergent large vessel occlusion. 2. Superimposed severe mid basilar stenosis, at the level of the pons. Severe stenosis of the proximal left PCA. 3. Small mid basilar contour deformity seen on recent MRA is felt related to atherosclerosis. No basilar artery dissection or aneurysm. 4. Atherosclerosis of the left carotid bifurcation with High-grade (66%) stenosis of the Left ICA origin. 5. Elsewhere calcified carotid atherosclerosis without associated stenosis. Mild for age anterior circulation atherosclerosis. 6. Stable CT appearance of the brain. Small pontine infarct remains occult by CT. 7. Cardiomegaly.  Calcified coronary artery atherosclerosis. Electronically Signed: By: Odessa Fleming M.D. On: 01/30/2016 08:34   Ct Head Wo Contrast  Result Date: 01/29/2016 CLINICAL DATA:  Dizziness and vomiting.  Right arm numbness EXAM: CT HEAD WITHOUT  CONTRAST TECHNIQUE: Contiguous axial images were obtained from the base of the skull through the vertex without intravenous contrast. COMPARISON:  None available FINDINGS: Brain: No evidence of acute infarction, hemorrhage, hydrocephalus, extra-axial collection or mass lesion/mass effect. Generalized atrophy, mild for age. Small vessel ischemic gliosis in the cerebral white matter, mild for  age. Remote appearing small vessel infarcts in the bilateral cerebellum. Vascular: Diffuse atherosclerotic calcification. Skull: Negative Sinuses/Orbits: Left cataract resection.  No acute finding. IMPRESSION: 1. No acute finding. 2. Mild for age atrophy and chronic microvascular disease. Chronic appearing small vessel infarcts in the bilateral cerebellum. Electronically Signed   By: Marnee Spring M.D.   On: 01/29/2016 21:40   Ct Angio Neck W And/or Wo Contrast  Addendum Date: 01/30/2016   ADDENDUM REPORT: 01/30/2016 08:59 ADDENDUM: Study discussed by telephone with Dr. Erenest Blank On 01/30/2016 at 0852 hours. We discussed that I favor the distal right vertebral artery and left PCA occlusions are chronic in light of no acute ischemia in those vascular territories on the MRI earlier today. Electronically Signed   By: Odessa Fleming M.D.   On: 01/30/2016 08:59   Result Date: 01/30/2016 CLINICAL DATA:  80 year old male with right para median pontine lacunar infarct. 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 Initial encounter. EXAM: CT ANGIOGRAPHY HEAD AND NECK TECHNIQUE: Multidetector CT imaging of the head and neck was performed using the standard protocol during bolus administration of intravenous contrast. Multiplanar CT image reconstructions and MIPs were obtained to evaluate the vascular anatomy. Carotid stenosis measurements (when applicable) are obtained utilizing NASCET criteria, using the distal internal carotid diameter as the denominator. CONTRAST:  50 mL Isovue 370 COMPARISON:  Brain MRI and MRA 01/30/2016. Head CT without contrast 01/29/2016. FINDINGS: CTA NECK Skeleton: Degenerative changes in the cervical spine. No acute osseous abnormality identified. Absent dentition Visualized paranasal sinuses and mastoids are stable and well pneumatized. Other neck: Partially visible cardiomegaly with coronary artery calcified atherosclerosis. Mild dependent opacity in the upper lungs  compatible with atelectasis. No superior mediastinal lymphadenopathy. Negative thyroid, larynx (glottis is closed), pharynx, parapharyngeal spaces, retropharyngeal space, sublingual space, submandibular glands and parotid glands. Cervical lymph nodes are within normal limits. Aortic arch: 3 vessel arch configuration. Widespread soft atherosclerotic plaque in the arch. Mild calcified plaque. Right carotid system: Tortuous brachiocephalic artery and proximal right CCA with no stenosis. Mild calcified plaque at the right carotid bifurcation cava no stenosis. Mild calcified plaque of the right CCA just below the skullbase with no stenosis. Left carotid system: No left CCA origin stenosis despite soft and calcified plaque. Tortuous proximal left CCA. Bulky combined soft and calcified plaque at the left carotid bifurcation mostly affecting the left ICA origin and proximal bulb. Subsequent high-grade stenosis numerically estimated at up to 66 % with respect to the distal vessel. Distal to the bulb there is only calcified plaque in the distal cervical left ICA just below the skullbase, with no associated stenosis. Vertebral arteries: No proximal right subclavian artery stenosis. Calcified plaque at the right vertebral artery origin with moderate stenosis (series 609, image 126). The right vertebral is non dominant and diminutive throughout the neck. It occludes in the right V3 segment at the C1 vertebral level on series 5, image 82. No proximal left subclavian stenosis. Normal left vertebral artery origin. The left V1 segment is somewhat dolichoectatic. The left vertebral is dominant, and widely patent to the skullbase. CTA HEAD Posterior circulation: The right V4 segment is occluded with no reconstituted flow.  There is calcified plaque in the dominant left V4 segment with mild stenosis (series 609, image 126). The left vertebral supplies the basilar. Mild calcified plaque at the vertebrobasilar junction with mild stenosis.  Right AICA origin is patent. The basilar artery is irregular, and there is a moderate to severe mid basilar stenosis (series 610, image 262 and series 609, image 104). This is at the level of the pons. The left lateral contour deformity of the vessel seen by MRA is re- demonstrated on series 609, image 105. There is no associated dissection, and this is not a saccular aneurysm. The distal basilar has a more normal caliber. The SCA and right PCA origins are normal. Posterior communicating arteries are diminutive or absent. Moderate to severe tandem stenoses of the proximal left PCA at its origin and in the P1 segment. The right P1 and P2 are normal. There is moderate irregularity of the right P3 branches. The left PCA is occluded in the distal P2 segment. Anterior circulation: Both ICA siphons are patent. There is moderate siphon calcified plaque bilaterally but no significant stenosis. Normal ophthalmic artery origins. Patent carotid termini. Normal MCA and ACA origins. The right A1 is dominant, the left is diminutive. Anterior communicating artery and bilateral ACA branches are normal. Left MCA M1 segment is tortuous. There is mild irregularity and stenosis in the distal M1. Left MCA bifurcation and branches are normal. Right MCA M1 segment, bifurcation, and right MCA branches are normal. Venous sinuses: Patent. Anatomic variants: Dominant left vertebral artery. Dominant right ACA A1 segment. Delayed phase: Stable supratentorial gray-white matter differentiation. The small pontine lacune remains occult by CT. No abnormal enhancement identified. IMPRESSION: 1. Severe posterior circulation atherosclerosis. Age indeterminate occlusions of the non dominant distal Right vertebral artery (V3 and V4 segments), and distal Left PCA (distal P2 and P3 segments). Otherwise no emergent large vessel occlusion. 2. Superimposed severe mid basilar stenosis, at the level of the pons. Severe stenosis of the proximal left PCA. 3.  Small mid basilar contour deformity seen on recent MRA is felt related to atherosclerosis. No basilar artery dissection or aneurysm. 4. Atherosclerosis of the left carotid bifurcation with High-grade (66%) stenosis of the Left ICA origin. 5. Elsewhere calcified carotid atherosclerosis without associated stenosis. Mild for age anterior circulation atherosclerosis. 6. Stable CT appearance of the brain. Small pontine infarct remains occult by CT. 7. Cardiomegaly.  Calcified coronary artery atherosclerosis. Electronically Signed: By: Odessa Fleming M.D. On: 01/30/2016 08:34   Mr Maxine Glenn Head Wo Contrast  Result Date: 01/30/2016 CLINICAL DATA:  80 y/o M; history of stroke, complaining of ongoing generalized weakness since yesterday. EXAM: MRI HEAD WITHOUT CONTRAST MRA HEAD WITHOUT CONTRAST TECHNIQUE: Multiplanar, multiecho pulse sequences of the brain and surrounding structures were obtained without intravenous contrast. Angiographic images of the head were obtained using MRA technique without contrast. COMPARISON:  CT head dated 01/29/2016. FINDINGS: MRI HEAD FINDINGS Brain: Right anterior paramedian pontine focus of diffusion restriction measuring 7 mm, series 5, image 15, consistent with acute/ early subacute infarction. There is associated T2 FLAIR hyperintensity. No abnormal susceptibility hypointensity to indicate intracranial hemorrhage. Moderate chronic microvascular ischemic changes and parenchymal volume loss. Chronic lacunar infarct within the left cerebellar hemisphere. Extra-axial space: No hydrocephalus. No midline shift. No effacement of basilar cisterns. No extra-axial collection is identified. Proximal intracranial flow voids are maintained. No abnormality of the cervical medullary junction. Other: Mild mucosal thickening of the anterior ethmoid air cells. No abnormal signal of the mastoid air cells. Left intra-ocular lens  replacement. New Calvarium is unremarkable. MRA HEAD FINDINGS Internal carotid arteries:  Mild irregularity bilaterally with short segments of mild stenosis. Anterior cerebral arteries: Large right A1, small left A1, and large anterior communicating artery, normal variant. Middle cerebral arteries: Patent bilaterally. Short segment of mild stenosis of distal left M1 segment. Anterior communicating artery: Patent. Posterior communicating arteries: Left posterior communicating artery demonstrates an irregular flow voids in areas of narrowing, probably related to intracranial atherosclerosis. Posterior cerebral arteries: Diminished distal flow related signal left PCA of uncertain significance. Basilar artery:  Patent. Vertebral arteries: Patent left vertebral artery. No appreciable right vertebral artery, hypoplastic or occluded. Small proximal basilar signal void is probably a fenestration. The 3 mm left laterally directed mid basilar outpouching, series 6, image 91, may represent a aneurysm or dissection flap. Short segment of narrowing of the vessel downstream to the lesion at the level of infarction. IMPRESSION: 1. Mid basilar 3 mm outpouching may represent a small aneurysm or dissection flap. This can be further characterize with CT angiogram of the head. 2. Small focus of acute/early subacute infarction within the anterior right paramedian pons. 3. Areas of mild vessel irregularity and narrowing consistent with intracranial atherosclerosis. No large vessel occlusion. 4. Moderate chronic microvascular ischemic changes and parenchymal volume loss of the brain. Chronic left cerebellar lacunar infarct. 5. 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 These results were called by telephone at the time of interpretation on 01/30/2016 at 5:11 am to Dr. Ross Marcus , who verbally acknowledged these results. Electronically Signed   By: Mitzi Hansen M.D.   On: 01/30/2016 05:09   Mr Brain Wo Contrast  Result Date: 01/30/2016 CLINICAL DATA:   80 y/o M; history of stroke, complaining of ongoing generalized weakness since yesterday. EXAM: MRI HEAD WITHOUT CONTRAST MRA HEAD WITHOUT CONTRAST TECHNIQUE: Multiplanar, multiecho pulse sequences of the brain and surrounding structures were obtained without intravenous contrast. Angiographic images of the head were obtained using MRA technique without contrast. COMPARISON:  CT head dated 01/29/2016. FINDINGS: MRI HEAD FINDINGS Brain: Right anterior paramedian pontine focus of diffusion restriction measuring 7 mm, series 5, image 15, consistent with acute/ early subacute infarction. There is associated T2 FLAIR hyperintensity. No abnormal susceptibility hypointensity to indicate intracranial hemorrhage. Moderate chronic microvascular ischemic changes and parenchymal volume loss. Chronic lacunar infarct within the left cerebellar hemisphere. Extra-axial space: No hydrocephalus. No midline shift. No effacement of basilar cisterns. No extra-axial collection is identified. Proximal intracranial flow voids are maintained. No abnormality of the cervical medullary junction. Other: Mild mucosal thickening of the anterior ethmoid air cells. No abnormal signal of the mastoid air cells. Left intra-ocular lens replacement. New Calvarium is unremarkable. MRA HEAD FINDINGS Internal carotid arteries: Mild irregularity bilaterally with short segments of mild stenosis. Anterior cerebral arteries: Large right A1, small left A1, and large anterior communicating artery, normal variant. Middle cerebral arteries: Patent bilaterally. Short segment of mild stenosis of distal left M1 segment. Anterior communicating artery: Patent. Posterior communicating arteries: Left posterior communicating artery demonstrates an irregular flow voids in areas of narrowing, probably related to intracranial atherosclerosis. Posterior cerebral arteries: Diminished distal flow related signal left PCA of uncertain significance. Basilar artery:  Patent.  Vertebral arteries: Patent left vertebral artery. No appreciable right vertebral artery, hypoplastic or occluded. Small proximal basilar signal void is probably a fenestration. The 3 mm left laterally directed mid basilar outpouching, series 6, image 91, may represent a aneurysm or dissection flap. Short segment of narrowing of  the vessel downstream to the lesion at the level of infarction. IMPRESSION: 1. Mid basilar 3 mm outpouching may represent a small aneurysm or dissection flap. This can be further characterize with CT angiogram of the head. 2. Small focus of acute/early subacute infarction within the anterior right paramedian pons. 3. Areas of mild vessel irregularity and narrowing consistent with intracranial atherosclerosis. No large vessel occlusion. 4. Moderate chronic microvascular ischemic changes and parenchymal volume loss of the brain. Chronic left cerebellar lacunar infarct. 5. 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 These results were called by telephone at the time of interpretation on 01/30/2016 at 5:11 am to Dr. Ross MarcusOURTNEY HORTON , who verbally acknowledged these results. Electronically Signed   By: Mitzi HansenLance  Furusawa-Stratton M.D.   On: 01/30/2016 05:09   2D Echocardiogram  - Left ventricle: The cavity size was normal. Wall thickness was increased in a pattern of severe LVH. Systolic function was vigorous. The estimated ejection fraction was in the range of 65% to 70%. Wall motion was normal; there were no regional wall motion abnormalities. Doppler parameters are consistent with abnormal left ventricular relaxation (grade 1 diastolic dysfunction). The E/e&' ratio is between 8-15, suggesting indeterminate LV filling pressure. - Mitral valve: Calcified annulus. There was trivial regurgitation. - Left atrium: The atrium was normal in size. - Inferior vena cava: The vessel was normal in size. The respirophasic diameter changes were in the  normal range (>= 50%), consistent with normal central venous pressure. Impressions:   LVEF 65-70%, severe LVH, normal wall motion, diastolic dysfunction, indeterminate LV filling pressure, trivial MR, normal LA size, normal IVC.   PHYSICAL EXAM Pleasant elderly male currently not in distress. . Afebrile. Head is nontraumatic. Neck is supple without bruit.    Cardiac exam no murmur or gallop. Lungs are clear to auscultation. Distal pulses are well felt. Neurological Exam :  Awake  Alert oriented x 3. Normal speech and language.eye movements full without nystagmus.fundi were not visualized. Vision acuity and fields appear normal. Hearing is normal. Palatal movements are normal. Face symmetric. Tongue midline. Normal strength, tone, reflexes and coordination. Normal sensation. Gait deferred.   ASSESSMENT/PLAN Mr. Gwenevere GhaziJesse R Aldana is a 80 y.o. male with history of stroke and gout presenting with R sided weakness. He did not receive IV t-PA due to deficits resolved.   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 L ICA  66% stenosis. Small pontine infarct remains occult   MRI & MRA  Mid basilar 3 mm outpouching, possible small aneurysm or dissection flap. acute/subacute infarction anterior R paramedian pons. No large vessel occlusion. small vessel disease Old L cerebellar lacunar   2D Echo  EF 65-70%. Severe LVH. No source of embolus   LDL 57  HgbA1c 5.7  Lovenox 40 mg sq daily for VTE prophylaxis Diet Heart Room service appropriate? Yes; Fluid consistency: Thin  No antithrombotic prior to admission, now on aspirin 325 mg daily and clopidogrel 75 mg daily given basilar artery stenosis. Continue dual antiplatelets x 3 months then plavix alone  Patient counseled to be compliant with his antithrombotic medications  Ongoing  aggressive stroke risk factor management  Therapy recommendations:  HH PT  Disposition:  Return home  Stroke team will sign off  Follow up NP for Dr. Pearlean BrownieSEthi in 2 months. Order placed  Elevated BP  Hyperlipidemia  Home meds:  lovaza, resumed in hospital  LDL 57, at goal < 70  Continue lovaza at discharge, no indication for statin  Other Stroke Risk Factors  Advanced age  Other Active Problems  Pancytopenia  Hx gout  Hospital day # 1  Rhoderick MoodyBIBY,SHARON  [facility] See Amion for Pager information 01/31/2016 11:39 AM   I have personally examined this patient, reviewed notes, independently viewed imaging studies, participated in medical decision making and plan of care. I have made any additions or clarifications directly to the above note. Agree with note above.  Stroke team will sign off. Kindly call for questions.  Delia HeadyPramod Jaonna Word, MD Medical Director [facility] Pager: [phone] 01/31/2016 5:23 PM  To contact Stroke Continuity provider, please refer to [email or web address]. After hours, contact General Neurology

## 2016-01-31 NOTE — Evaluation (Signed)
Occupational Therapy Evaluation Patient Details Name: Trevor Bishop MRN: [id number] DOB: [birth year] Today's Date: 01/31/2016    History of Present Illness Trevor EstelleJesse R Youngis an 80 y.o.male was admitted with onset of weakness  with numbness and slurred speech. MRI (+) Small focus of acute/early subacute infarction within the PMH includes gout, previous stroke, and arthritis.   Clinical Impression   PT admitted with R Pon CVA. Pt currently with functional limitiations due to the deficits listed below (see OT problem list). PTA was independent at home.  Pt will benefit from skilled OT to increase their independence and safety with adls and balance to allow discharge HHOT.     Follow Up Recommendations  Home health OT    Equipment Recommendations  None recommended by OT    Recommendations for Other Services       Precautions / Restrictions Precautions Precautions: Fall      Mobility Bed Mobility Overal bed mobility: Modified Independent                Transfers Overall transfer level: Needs assistance   Transfers: Sit to/from Stand Sit to Stand: Min guard              Balance Overall balance assessment: Needs assistance Sitting-balance support: No upper extremity supported;Feet supported Sitting balance-Leahy Scale: Good     Standing balance support: Bilateral upper extremity supported;During functional activity Standing balance-Leahy Scale: Fair                              ADL Overall ADL's : Needs assistance/impaired Eating/Feeding: Set up;Sitting   Grooming: Set up;Sitting               Lower Body Dressing: Minimal assistance;Sit to/from stand   Toilet Transfer: Hydrographic surveyorMin guard Toilet Transfer Details (indicate cue type and reason): pt holding onto environmental supports to transfer.          Functional mobility during ADLs: Min guard General ADL Comments: Pt reports balance is different than baseline. pt states I rather die or  go somewhere to live prior to going home with my wife and require her care'      Vision Vision Assessment?: Yes Eye Alignment: Within Functional Limits Ocular Range of Motion: Within Functional Limits Additional Comments: pt able to scan handout and locate all the "n" correctly. pt able to scan an abstract picture for objects and locate correctly. pt however demonstrates cognitive deficits with visual task to draw a clock   Perception     Praxis      Pertinent Vitals/Pain Pain Assessment: No/denies pain     Hand Dominance Right   Extremity/Trunk Assessment Upper Extremity Assessment Upper Extremity Assessment: Generalized weakness;LUE deficits/detail LUE Deficits / Details: edema noted and reports pain at IV site   Lower Extremity Assessment Lower Extremity Assessment: Defer to PT evaluation   Cervical / Trunk Assessment Cervical / Trunk Assessment: Kyphotic   Communication Communication Communication: No difficulties   Cognition Arousal/Alertness: Awake/alert Behavior During Therapy: WFL for tasks assessed/performed Overall Cognitive Status: Impaired/Different from baseline Area of Impairment: Safety/judgement         Safety/Judgement: Decreased awareness of safety     General Comments: pt demonstrates deficits with drawing a clock facing. pt writing numbers too far apart and showed good recognition of spacing deficit and requested to redraw clock. However upon redrawing clock patient writing numbers in reverse order.    General Comments  Exercises       Shoulder Instructions      Home Living Family/patient expects to be discharged to:: Private residence Living Arrangements: Spouse/significant other Available Help at Discharge: Family Type of Home: House Home Access: Stairs to enter Secretary/administratorntrance Stairs-Number of Steps: 1 Entrance Stairs-Rails: None Home Layout: One level     Bathroom Shower/Tub: Chief Strategy OfficerTub/shower unit   Bathroom Toilet: Standard      Home Equipment: Environmental consultantWalker - 2 wheels   Additional Comments: pt reports living with wife but does not want wife or wifes family to provide care for him. Pt describes wife as "my wife is an asshole" Pt very serious flat affect when discussing wife's ability to (A) and does not want her to care for him. pt very clear on this fact. Pt describes having 8 children and step children of his wife. Pt watches 499 yo and 318 yo grandchildren after school. Pt states "they watch me more than I watch them. they dont miss a thing I do"      Prior Functioning/Environment Level of Independence: Independent        Comments: Has RW that he used during episode of gout. Step-daughter occasionally provides assistance for driving.     OT Diagnosis: Generalized weakness   OT Problem List: Decreased strength;Decreased activity tolerance;Impaired balance (sitting and/or standing);Decreased safety awareness;Decreased knowledge of use of DME or AE;Decreased knowledge of precautions;Decreased cognition   OT Treatment/Interventions: Self-care/ADL training;Therapeutic exercise;DME and/or AE instruction;Therapeutic activities;Cognitive remediation/compensation;Visual/perceptual remediation/compensation;Patient/family education;Balance training    OT Goals(Current goals can be found in the care plan section) Acute Rehab OT Goals Patient Stated Goal: to go home today OT Goal Formulation: With patient Time For Goal Achievement: 02/14/16 Potential to Achieve Goals: Good  OT Frequency: Min 2X/week   Barriers to D/C: Decreased caregiver support          Co-evaluation              End of Session Nurse Communication: Mobility status;Precautions  Activity Tolerance: Patient tolerated treatment well Patient left: in chair;with call bell/phone within reach   Time: 1422-1500 OT Time Calculation (min): 38 min Charges:  OT General Charges $OT Visit: 1 Procedure OT Evaluation $OT Eval Moderate Complexity: 1  Procedure OT Treatments $Self Care/Home Management : 8-22 mins G-Codes:    Trevor Bishop, Trevor Bishop 01/31/2016, 3:26 PM   Trevor Bishop, Brynn   OTR/L Pager: [phone] Office: [phone] .

## 2016-02-01 LAB — CBC
HCT: 38.1 % — ABNORMAL LOW (ref 39.0–52.0)
Hemoglobin: 11.4 g/dL — ABNORMAL LOW (ref 13.0–17.0)
MCH: 25.3 pg — ABNORMAL LOW (ref 26.0–34.0)
MCHC: 29.9 g/dL — AB (ref 30.0–36.0)
MCV: 84.5 fL (ref 78.0–100.0)
PLATELETS: 136 10*3/uL — AB (ref 150–400)
RBC: 4.51 MIL/uL (ref 4.22–5.81)
RDW: 15.2 % (ref 11.5–15.5)
WBC: 2.8 10*3/uL — AB (ref 4.0–10.5)

## 2016-02-01 MED ORDER — NAPROXEN 500 MG PO TABS: 500.0000 mg | ORAL_TABLET | Freq: Two times a day (BID) | ORAL | 0 refills | Status: AC | PRN

## 2016-02-01 MED ORDER — CLOPIDOGREL BISULFATE 75 MG PO TABS: 75.0000 mg | ORAL_TABLET | Freq: Every day | ORAL | 0 refills | Status: AC

## 2016-02-01 MED ORDER — NAPROXEN 250 MG PO TABS: 500.0000 mg | ORAL_TABLET | Freq: Two times a day (BID) | ORAL | Status: DC

## 2016-02-01 MED ORDER — ASPIRIN EC 81 MG PO TBEC: 81.0000 mg | DELAYED_RELEASE_TABLET | Freq: Every day | ORAL | 0 refills | Status: AC

## 2016-02-01 MED ORDER — NAPROXEN 250 MG PO TABS
500.0000 mg | ORAL_TABLET | Freq: Two times a day (BID) | ORAL | Status: DC | PRN
  Administered 2016-02-01: 500 mg via ORAL
  Filled 2016-02-01: qty 2

## 2016-02-01 MED ORDER — NAPROXEN SODIUM 275 MG PO TABS: 440.0000 mg | ORAL_TABLET | Freq: Two times a day (BID) | ORAL | Status: DC

## 2016-02-01 NOTE — Care Management Note (Signed)
Case Management Note  Patient Details  Name: Trevor Bishop MRN: [id number] Date of Birth: 1936-05-16  Subjective/Objective:                    Action/Plan: Pt discharging home with [facility] services. CM met with the patient and provided him a list of [facility] agencies in the [place] area. He selected [place] [facility] [facility]. Tiffany with [facility] notified and accepted the referral. Will update the bedside RN.   Expected Discharge Date:                  Expected Discharge Plan:  [place]  In-House Referral:     Discharge planning Services  CM Consult  Post Acute Care Choice:  Home Health Choice offered to:  Patient  DME Arranged:    DME Agency:     HH Arranged:  PT, OT HH Agency:  [facility]  Status of Service:  Completed, signed off  If discussed at [place] of Stay Meetings, dates discussed:    Additional Comments:  Pollie Friar, RN 02/01/2016, 2:34 PM

## 2016-02-01 NOTE — Discharge Summary (Signed)
Physician Discharge Summary  Trevor WILBER OZH:[id number] DOB: Dec 13, 1935  PCP: Lolita Patella, MD  Admit date: 01/30/2016 Discharge date: 02/01/2016  Admitted From: Home Disposition:  Home  Recommendations for Outpatient Follow-up:  1. Dr. Fulton Mole, PCP in 5 days with repeat labs (CBC with differential & BMP). Recommend outpatient evaluation of pancytopenia. May need hematology consultation. 2. Nilda Riggs, NP/Neurology with Dr. Pearlean Brownie: MDs office will call patient with follow-up appointment.  Home Health: PT & OT Equipment/Devices: None    Discharge Condition: Improved and stable  CODE STATUS: Full  Diet recommendation: Heart healthy diet  Discharge Diagnoses:  Principal Problem:   Right pontine stroke [facility]) Active Problems:   Stroke (cerebrum) (HCC)   Stroke (HCC)   Brief/Interim Summary: Trevor Bishop a 80 y.o.malewith gout and previous stroke presents to the ER because of dizziness, (80 y.o. male) found on MRI to have anterior right paramedian pons infarction.   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 Left ICA 66% stenosis. Small pontine infarct remains occult by CT - MRI and MRA: Mild basilar 3 mm outpouching, possibly small aneurysm or dissection flap. Acute/subacute infarction anterior right paramedian pons. No large vessel occlusion. Small vessel disease. Old left cerebellar lacunar - 2-D echo: LVEF 65-70 percent. Severe LVH. No source of embolus. - LDL: 57 - A1c: 5.7. - No antithrombotic prior to admission, initially started on aspirin 325 MG daily and Plavix 75 MG daily given basilar artery stenosis. Discussed with Dr. Pearlean Brownie today who recommended changing aspirin to 81 MG + Plavix 75 MG 3 months followed by Plavix alone. - Therapies recommend  home health PT & OT. - Outpatient follow-up with neurology in 2 months.  Essential hypertension - Controlled  Hyperlipidemia - LDL 57, goal <70. Continue Lovaza at discharge.  History of gout - Patient today reported that he has mild pain and swelling of his left elbow consistent with previous episodes of gout flare for which she uses when necessary Aleve and "fish oil". Started patient on Naprosyn and advised him to take it 500 MG twice a day scheduled for 3-5 days and once acute flare resolves, he can take it as needed. He verbalized understanding. Discussed with nursing to ensure that he gets a dose this morning.  Pancytopenia Unclear etiology. Outpatient evaluation as deemed necessary. Thrombocytopenia seems chronic.      Consultants:   Neurology  Procedures:   2-D echo 01/30/16: Study Conclusions  - Left ventricle: The cavity size was normal. Wall thickness was   increased in a pattern of severe LVH. Systolic function was   vigorous. The estimated ejection fraction was in the range of 65%   to 70%. Wall motion was normal; there were no regional wall   motion abnormalities. Doppler parameters are consistent with   abnormal left ventricular relaxation (grade 1 diastolic   dysfunction). The E/e&' ratio is between 8-15, suggesting   indeterminate LV filling pressure. - Mitral valve: Calcified annulus. There was trivial regurgitation. - Left atrium: The atrium was normal in size. - Inferior vena cava: The vessel was normal in size. The   respirophasic diameter changes were in the normal range (>= 50%),   consistent with normal central venous pressure.  Impressions:  - LVEF 65-70%, severe LVH, normal wall motion, diastolic   dysfunction, indeterminate LV filling pressure, trivial MR,  normal LA size, normal IVC.   Discharge Instructions  Discharge Instructions    Ambulatory referral to Neurology    Complete by:  As directed   Stroke patient. Dr. Pearlean Brownie  requests pt to be see by NP in the office for follow up in 2 months   Call MD for:    Complete by:  As directed   Strokelike symptoms.   Call MD for:  redness, tenderness, or signs of infection (pain, swelling, redness, odor or green/yellow discharge around incision site)    Complete by:  As directed   Call MD for:  severe uncontrolled pain    Complete by:  As directed   Call MD for:  temperature >100.4    Complete by:  As directed   Diet - low sodium heart healthy    Complete by:  As directed   Increase activity slowly    Complete by:  As directed       Medication List    STOP taking these medications   ibuprofen 200 MG tablet Commonly known as:  ADVIL,MOTRIN   naproxen sodium 220 MG tablet Commonly known as:  ANAPROX     TAKE these medications   aspirin EC 81 MG tablet Take 1 tablet (81 mg total) by mouth daily.   clopidogrel 75 MG tablet Commonly known as:  PLAVIX Take 1 tablet (75 mg total) by mouth daily.   ICY HOT POWER 16 % Gel Generic drug:  Menthol (Topical Analgesic) Apply 1 application topically daily as needed (general pain / arm).   naproxen 500 MG tablet Commonly known as:  NAPROSYN Take 1 tablet (500 mg total) by mouth 2 (two) times daily as needed. 2 times daily regularly for 3-5 days then as needed for gouty pain.   omega-3 acid ethyl esters 1 g capsule Commonly known as:  LOVAZA Take 1 g by mouth daily.   OVER THE COUNTER MEDICATION Take 1 tablet by mouth daily. "Prostratus"      Follow-up Information    MARTIN,NANCY CAROLYN, NP Follow up in 2 month(s).   Specialty:  Family Medicine Why:  stroke clinic. NP for DR. Sethi. office will call you with appintment date and time Contact information: [address] [place] [address] [phone]        Lolita Patella, MD Follow up in 5 day(s).   Specialty:  Family Medicine Why:  To be seen with repeat labs (CBC with differential, BMP). Recommend outpatient evaluation of  pancytopenia. May need hematology consultation. Contact information: [address] [place] [address] [phone]          No Known Allergies   Procedures/Studies: Ct Angio Head W Or Wo Contrast  Addendum Date: 01/30/2016   ADDENDUM REPORT: 01/30/2016 08:59 ADDENDUM: Study discussed by telephone with Dr. Erenest Blank On 01/30/2016 at 0852 hours. We discussed that I favor the distal right vertebral artery and left PCA occlusions are chronic in light of no acute ischemia in those vascular territories on the MRI earlier today. Electronically Signed   By: Odessa Fleming M.D.   On: 01/30/2016 08:59   Result Date: 01/30/2016 CLINICAL DATA:  80 year old male with right para median pontine lacunar infarct. 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 Initial encounter. EXAM: CT ANGIOGRAPHY HEAD AND NECK TECHNIQUE: Multidetector CT imaging of the head and neck was performed using the standard protocol during bolus administration of intravenous contrast. Multiplanar CT image reconstructions and MIPs were obtained to evaluate  the vascular anatomy. Carotid stenosis measurements (when applicable) are obtained utilizing NASCET criteria, using the distal internal carotid diameter as the denominator. CONTRAST:  50 mL Isovue 370 COMPARISON:  Brain MRI and MRA 01/30/2016. Head CT without contrast 01/29/2016. FINDINGS: CTA NECK Skeleton: Degenerative changes in the cervical spine. No acute osseous abnormality identified. Absent dentition Visualized paranasal sinuses and mastoids are stable and well pneumatized. Other neck: Partially visible cardiomegaly with coronary artery calcified atherosclerosis. Mild dependent opacity in the upper lungs compatible with atelectasis. No superior mediastinal lymphadenopathy. Negative thyroid, larynx (glottis is closed), pharynx, parapharyngeal spaces, retropharyngeal space, sublingual space, submandibular glands and parotid glands. Cervical lymph  nodes are within normal limits. Aortic arch: 3 vessel arch configuration. Widespread soft atherosclerotic plaque in the arch. Mild calcified plaque. Right carotid system: Tortuous brachiocephalic artery and proximal right CCA with no stenosis. Mild calcified plaque at the right carotid bifurcation cava no stenosis. Mild calcified plaque of the right CCA just below the skullbase with no stenosis. Left carotid system: No left CCA origin stenosis despite soft and calcified plaque. Tortuous proximal left CCA. Bulky combined soft and calcified plaque at the left carotid bifurcation mostly affecting the left ICA origin and proximal bulb. Subsequent high-grade stenosis numerically estimated at up to 66 % with respect to the distal vessel. Distal to the bulb there is only calcified plaque in the distal cervical left ICA just below the skullbase, with no associated stenosis. Vertebral arteries: No proximal right subclavian artery stenosis. Calcified plaque at the right vertebral artery origin with moderate stenosis (series 609, image 126). The right vertebral is non dominant and diminutive throughout the neck. It occludes in the right V3 segment at the C1 vertebral level on series 5, image 82. No proximal left subclavian stenosis. Normal left vertebral artery origin. The left V1 segment is somewhat dolichoectatic. The left vertebral is dominant, and widely patent to the skullbase. CTA HEAD Posterior circulation: The right V4 segment is occluded with no reconstituted flow. There is calcified plaque in the dominant left V4 segment with mild stenosis (series 609, image 126). The left vertebral supplies the basilar. Mild calcified plaque at the vertebrobasilar junction with mild stenosis. Right AICA origin is patent. The basilar artery is irregular, and there is a moderate to severe mid basilar stenosis (series 610, image 262 and series 609, image 104). This is at the level of the pons. The left lateral contour deformity of the  vessel seen by MRA is re- demonstrated on series 609, image 105. There is no associated dissection, and this is not a saccular aneurysm. The distal basilar has a more normal caliber. The SCA and right PCA origins are normal. Posterior communicating arteries are diminutive or absent. Moderate to severe tandem stenoses of the proximal left PCA at its origin and in the P1 segment. The right P1 and P2 are normal. There is moderate irregularity of the right P3 branches. The left PCA is occluded in the distal P2 segment. Anterior circulation: Both ICA siphons are patent. There is moderate siphon calcified plaque bilaterally but no significant stenosis. Normal ophthalmic artery origins. Patent carotid termini. Normal MCA and ACA origins. The right A1 is dominant, the left is diminutive. Anterior communicating artery and bilateral ACA branches are normal. Left MCA M1 segment is tortuous. There is mild irregularity and stenosis in the distal M1. Left MCA bifurcation and branches are normal. Right MCA M1 segment, bifurcation, and right MCA branches are normal. Venous sinuses: Patent. Anatomic variants: Dominant left vertebral artery.  Dominant right ACA A1 segment. Delayed phase: Stable supratentorial gray-white matter differentiation. The small pontine lacune remains occult by CT. No abnormal enhancement identified. IMPRESSION: 1. Severe posterior circulation atherosclerosis. Age indeterminate occlusions of the non dominant distal Right vertebral artery (V3 and V4 segments), and distal Left PCA (distal P2 and P3 segments). Otherwise no emergent large vessel occlusion. 2. Superimposed severe mid basilar stenosis, at the level of the pons. Severe stenosis of the proximal left PCA. 3. Small mid basilar contour deformity seen on recent MRA is felt related to atherosclerosis. No basilar artery dissection or aneurysm. 4. Atherosclerosis of the left carotid bifurcation with High-grade (66%) stenosis of the Left ICA origin. 5.  Elsewhere calcified carotid atherosclerosis without associated stenosis. Mild for age anterior circulation atherosclerosis. 6. Stable CT appearance of the brain. Small pontine infarct remains occult by CT. 7. Cardiomegaly.  Calcified coronary artery atherosclerosis. Electronically Signed: By: Odessa Fleming M.D. On: 01/30/2016 08:34   Ct Head Wo Contrast  Result Date: 01/29/2016 CLINICAL DATA:  Dizziness and vomiting.  Right arm numbness EXAM: CT HEAD WITHOUT CONTRAST TECHNIQUE: Contiguous axial images were obtained from the base of the skull through the vertex without intravenous contrast. COMPARISON:  None available FINDINGS: Brain: No evidence of acute infarction, hemorrhage, hydrocephalus, extra-axial collection or mass lesion/mass effect. Generalized atrophy, mild for age. Small vessel ischemic gliosis in the cerebral white matter, mild for age. Remote appearing small vessel infarcts in the bilateral cerebellum. Vascular: Diffuse atherosclerotic calcification. Skull: Negative Sinuses/Orbits: Left cataract resection.  No acute finding. IMPRESSION: 1. No acute finding. 2. Mild for age atrophy and chronic microvascular disease. Chronic appearing small vessel infarcts in the bilateral cerebellum. Electronically Signed   By: Marnee Spring M.D.   On: 01/29/2016 21:40   Ct Angio Neck W And/or Wo Contrast  Addendum Date: 01/30/2016   ADDENDUM REPORT: 01/30/2016 08:59 ADDENDUM: Study discussed by telephone with Dr. Erenest Blank On 01/30/2016 at 0852 hours. We discussed that I favor the distal right vertebral artery and left PCA occlusions are chronic in light of no acute ischemia in those vascular territories on the MRI earlier today. Electronically Signed   By: Odessa Fleming M.D.   On: 01/30/2016 08:59   Result Date: 01/30/2016 CLINICAL DATA:  80 year old male with right para median pontine lacunar infarct. 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 Initial encounter. EXAM: CT ANGIOGRAPHY HEAD AND  NECK TECHNIQUE: Multidetector CT imaging of the head and neck was performed using the standard protocol during bolus administration of intravenous contrast. Multiplanar CT image reconstructions and MIPs were obtained to evaluate the vascular anatomy. Carotid stenosis measurements (when applicable) are obtained utilizing NASCET criteria, using the distal internal carotid diameter as the denominator. CONTRAST:  50 mL Isovue 370 COMPARISON:  Brain MRI and MRA 01/30/2016. Head CT without contrast 01/29/2016. FINDINGS: CTA NECK Skeleton: Degenerative changes in the cervical spine. No acute osseous abnormality identified. Absent dentition Visualized paranasal sinuses and mastoids are stable and well pneumatized. Other neck: Partially visible cardiomegaly with coronary artery calcified atherosclerosis. Mild dependent opacity in the upper lungs compatible with atelectasis. No superior mediastinal lymphadenopathy. Negative thyroid, larynx (glottis is closed), pharynx, parapharyngeal spaces, retropharyngeal space, sublingual space, submandibular glands and parotid glands. Cervical lymph nodes are within normal limits. Aortic arch: 3 vessel arch configuration. Widespread soft atherosclerotic plaque in the arch. Mild calcified plaque. Right carotid system: Tortuous brachiocephalic artery and proximal right CCA with no stenosis. Mild calcified plaque at the right carotid bifurcation cava  no stenosis. Mild calcified plaque of the right CCA just below the skullbase with no stenosis. Left carotid system: No left CCA origin stenosis despite soft and calcified plaque. Tortuous proximal left CCA. Bulky combined soft and calcified plaque at the left carotid bifurcation mostly affecting the left ICA origin and proximal bulb. Subsequent high-grade stenosis numerically estimated at up to 66 % with respect to the distal vessel. Distal to the bulb there is only calcified plaque in the distal cervical left ICA just below the skullbase, with  no associated stenosis. Vertebral arteries: No proximal right subclavian artery stenosis. Calcified plaque at the right vertebral artery origin with moderate stenosis (series 609, image 126). The right vertebral is non dominant and diminutive throughout the neck. It occludes in the right V3 segment at the C1 vertebral level on series 5, image 82. No proximal left subclavian stenosis. Normal left vertebral artery origin. The left V1 segment is somewhat dolichoectatic. The left vertebral is dominant, and widely patent to the skullbase. CTA HEAD Posterior circulation: The right V4 segment is occluded with no reconstituted flow. There is calcified plaque in the dominant left V4 segment with mild stenosis (series 609, image 126). The left vertebral supplies the basilar. Mild calcified plaque at the vertebrobasilar junction with mild stenosis. Right AICA origin is patent. The basilar artery is irregular, and there is a moderate to severe mid basilar stenosis (series 610, image 262 and series 609, image 104). This is at the level of the pons. The left lateral contour deformity of the vessel seen by MRA is re- demonstrated on series 609, image 105. There is no associated dissection, and this is not a saccular aneurysm. The distal basilar has a more normal caliber. The SCA and right PCA origins are normal. Posterior communicating arteries are diminutive or absent. Moderate to severe tandem stenoses of the proximal left PCA at its origin and in the P1 segment. The right P1 and P2 are normal. There is moderate irregularity of the right P3 branches. The left PCA is occluded in the distal P2 segment. Anterior circulation: Both ICA siphons are patent. There is moderate siphon calcified plaque bilaterally but no significant stenosis. Normal ophthalmic artery origins. Patent carotid termini. Normal MCA and ACA origins. The right A1 is dominant, the left is diminutive. Anterior communicating artery and bilateral ACA branches are  normal. Left MCA M1 segment is tortuous. There is mild irregularity and stenosis in the distal M1. Left MCA bifurcation and branches are normal. Right MCA M1 segment, bifurcation, and right MCA branches are normal. Venous sinuses: Patent. Anatomic variants: Dominant left vertebral artery. Dominant right ACA A1 segment. Delayed phase: Stable supratentorial gray-white matter differentiation. The small pontine lacune remains occult by CT. No abnormal enhancement identified. IMPRESSION: 1. Severe posterior circulation atherosclerosis. Age indeterminate occlusions of the non dominant distal Right vertebral artery (V3 and V4 segments), and distal Left PCA (distal P2 and P3 segments). Otherwise no emergent large vessel occlusion. 2. Superimposed severe mid basilar stenosis, at the level of the pons. Severe stenosis of the proximal left PCA. 3. Small mid basilar contour deformity seen on recent MRA is felt related to atherosclerosis. No basilar artery dissection or aneurysm. 4. Atherosclerosis of the left carotid bifurcation with High-grade (66%) stenosis of the Left ICA origin. 5. Elsewhere calcified carotid atherosclerosis without associated stenosis. Mild for age anterior circulation atherosclerosis. 6. Stable CT appearance of the brain. Small pontine infarct remains occult by CT. 7. Cardiomegaly.  Calcified coronary artery atherosclerosis. Electronically Signed: By:  Odessa Fleming M.D. On: 01/30/2016 08:34   Mr Maxine Glenn Head Wo Contrast  Result Date: 01/30/2016 CLINICAL DATA:  80 y/o M; history of stroke, complaining of ongoing generalized weakness since yesterday. EXAM: MRI HEAD WITHOUT CONTRAST MRA HEAD WITHOUT CONTRAST TECHNIQUE: Multiplanar, multiecho pulse sequences of the brain and surrounding structures were obtained without intravenous contrast. Angiographic images of the head were obtained using MRA technique without contrast. COMPARISON:  CT head dated 01/29/2016. FINDINGS: MRI HEAD FINDINGS Brain: Right anterior  paramedian pontine focus of diffusion restriction measuring 7 mm, series 5, image 15, consistent with acute/ early subacute infarction. There is associated T2 FLAIR hyperintensity. No abnormal susceptibility hypointensity to indicate intracranial hemorrhage. Moderate chronic microvascular ischemic changes and parenchymal volume loss. Chronic lacunar infarct within the left cerebellar hemisphere. Extra-axial space: No hydrocephalus. No midline shift. No effacement of basilar cisterns. No extra-axial collection is identified. Proximal intracranial flow voids are maintained. No abnormality of the cervical medullary junction. Other: Mild mucosal thickening of the anterior ethmoid air cells. No abnormal signal of the mastoid air cells. Left intra-ocular lens replacement. New Calvarium is unremarkable. MRA HEAD FINDINGS Internal carotid arteries: Mild irregularity bilaterally with short segments of mild stenosis. Anterior cerebral arteries: Large right A1, small left A1, and large anterior communicating artery, normal variant. Middle cerebral arteries: Patent bilaterally. Short segment of mild stenosis of distal left M1 segment. Anterior communicating artery: Patent. Posterior communicating arteries: Left posterior communicating artery demonstrates an irregular flow voids in areas of narrowing, probably related to intracranial atherosclerosis. Posterior cerebral arteries: Diminished distal flow related signal left PCA of uncertain significance. Basilar artery:  Patent. Vertebral arteries: Patent left vertebral artery. No appreciable right vertebral artery, hypoplastic or occluded. Small proximal basilar signal void is probably a fenestration. The 3 mm left laterally directed mid basilar outpouching, series 6, image 91, may represent a aneurysm or dissection flap. Short segment of narrowing of the vessel downstream to the lesion at the level of infarction. IMPRESSION: 1. Mid basilar 3 mm outpouching may represent a small  aneurysm or dissection flap. This can be further characterize with CT angiogram of the head. 2. Small focus of acute/early subacute infarction within the anterior right paramedian pons. 3. Areas of mild vessel irregularity and narrowing consistent with intracranial atherosclerosis. No large vessel occlusion. 4. Moderate chronic microvascular ischemic changes and parenchymal volume loss of the brain. Chronic left cerebellar lacunar infarct. 5. 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 These results were called by telephone at the time of interpretation on 01/30/2016 at 5:11 am to Dr. Ross Marcus , who verbally acknowledged these results. Electronically Signed   By: Mitzi Hansen M.D.   On: 01/30/2016 05:09   Mr Brain Wo Contrast  Result Date: 01/30/2016 CLINICAL DATA:  80 y/o M; history of stroke, complaining of ongoing generalized weakness since yesterday. EXAM: MRI HEAD WITHOUT CONTRAST MRA HEAD WITHOUT CONTRAST TECHNIQUE: Multiplanar, multiecho pulse sequences of the brain and surrounding structures were obtained without intravenous contrast. Angiographic images of the head were obtained using MRA technique without contrast. COMPARISON:  CT head dated 01/29/2016. FINDINGS: MRI HEAD FINDINGS Brain: Right anterior paramedian pontine focus of diffusion restriction measuring 7 mm, series 5, image 15, consistent with acute/ early subacute infarction. There is associated T2 FLAIR hyperintensity. No abnormal susceptibility hypointensity to indicate intracranial hemorrhage. Moderate chronic microvascular ischemic changes and parenchymal volume loss. Chronic lacunar infarct within the left cerebellar hemisphere. Extra-axial space: No hydrocephalus. No midline shift. No  effacement of basilar cisterns. No extra-axial collection is identified. Proximal intracranial flow voids are maintained. No abnormality of the cervical medullary junction. Other:  Mild mucosal thickening of the anterior ethmoid air cells. No abnormal signal of the mastoid air cells. Left intra-ocular lens replacement. New Calvarium is unremarkable. MRA HEAD FINDINGS Internal carotid arteries: Mild irregularity bilaterally with short segments of mild stenosis. Anterior cerebral arteries: Large right A1, small left A1, and large anterior communicating artery, normal variant. Middle cerebral arteries: Patent bilaterally. Short segment of mild stenosis of distal left M1 segment. Anterior communicating artery: Patent. Posterior communicating arteries: Left posterior communicating artery demonstrates an irregular flow voids in areas of narrowing, probably related to intracranial atherosclerosis. Posterior cerebral arteries: Diminished distal flow related signal left PCA of uncertain significance. Basilar artery:  Patent. Vertebral arteries: Patent left vertebral artery. No appreciable right vertebral artery, hypoplastic or occluded. Small proximal basilar signal void is probably a fenestration. The 3 mm left laterally directed mid basilar outpouching, series 6, image 91, may represent a aneurysm or dissection flap. Short segment of narrowing of the vessel downstream to the lesion at the level of infarction. IMPRESSION: 1. Mid basilar 3 mm outpouching may represent a small aneurysm or dissection flap. This can be further characterize with CT angiogram of the head. 2. Small focus of acute/early subacute infarction within the anterior right paramedian pons. 3. Areas of mild vessel irregularity and narrowing consistent with intracranial atherosclerosis. No large vessel occlusion. 4. Moderate chronic microvascular ischemic changes and parenchymal volume loss of the brain. Chronic left cerebellar lacunar infarct. 5. 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 These results were called by telephone at the time of interpretation on 01/30/2016 at 5:11  am to Dr. Ross MarcusOURTNEY Bishop , who verbally acknowledged these results. Electronically Signed   By: Mitzi HansenLance  Furusawa-Stratton M.D.   On: 01/30/2016 05:09      Subjective: Patient complains of mild left elbow swelling and pain consistent with prior episodes of acute gouty arthritis for which she takes when necessary Aleve and "fish oil". Denies any other complaints. Anxious to go home.  Discharge Exam:  Vitals:   02/01/16 0500 02/01/16 0910 02/01/16 1007 02/01/16 1412  BP: 116/74 135/82 122/75 137/62  Pulse: 80 90 94 85  Resp: 18 16 18 18   Temp: 97.7 F (36.5 C) 98.8 F (37.1 C) 98.6 F (37 C) 99.1 F (37.3 C)  TempSrc: Oral Oral Oral Oral  SpO2: 98% 96% 95% 95%  Weight:      Height:        General exam: Pleasant elderly male sitting up comfortably in chair this morning. Respiratory system: Clear to auscultation. Respiratory effort normal. Cardiovascular system: S1 & S2 heard, RRR. No JVD, murmurs, rubs, gallops or clicks. No pedal edema. Telemetry: Sinus rhythm Gastrointestinal system: Abdomen is nondistended, soft and nontender. No organomegaly or masses felt. Normal bowel sounds heard. Central nervous system: Alert and oriented. No focal neurological deficits. Extremities: Symmetric 5 x 5 power. Mild swelling, tenderness and painful range of motion at dorsal left elbow without redness or increased warmth. Skin: No rashes, lesions or ulcers Psychiatry: Judgement and insight appear normal. Mood & affect appropriate.     The results of significant diagnostics from this hospitalization (including imaging, microbiology, ancillary and laboratory) are listed below for reference.     Microbiology: No results found for this or any previous visit (from the past 240 hour(s)).   Labs: BNP (last 3 results) No results  for input(s): BNP in the last 8760 hours. Basic Metabolic Panel:  Recent Labs Lab 01/29/16 2108 01/29/16 2135 01/30/16 0657  NA 141 143 142  K 3.6 3.6 3.6  CL 109  106 107  CO2 27  --  28  GLUCOSE 144* 142* 123*  BUN 8 8 8   CREATININE 0.94 0.90 0.93  CALCIUM 9.2  --  9.1   Liver Function Tests:  Recent Labs Lab 01/29/16 2108 01/30/16 0657  AST 37 25  ALT 32 27  ALKPHOS 63 55  BILITOT 0.5 0.5  PROT 6.9 6.4*  ALBUMIN 4.0 3.7   No results for input(s): LIPASE, AMYLASE in the last 168 hours. No results for input(s): AMMONIA in the last 168 hours. CBC:  Recent Labs Lab 01/29/16 2108 01/29/16 2135 01/30/16 0657 02/01/16 0315  WBC 3.1*  --  2.6* 2.8*  NEUTROABS 1.9  --   --   --   HGB 12.4* 13.3 12.5* 11.4*  HCT 40.7 39.0 41.3 38.1*  MCV 85.0  --  85.2 84.5  PLT 135*  --  121* 136*   Cardiac Enzymes: No results for input(s): CKTOTAL, CKMB, CKMBINDEX, TROPONINI in the last 168 hours. BNP: Invalid input(s): POCBNP CBG: No results for input(s): GLUCAP in the last 168 hours. D-Dimer No results for input(s): DDIMER in the last 72 hours. Hgb A1c  Recent Labs  01/30/16 0656  HGBA1C 5.7*   Lipid Profile  Recent Labs  01/30/16 0657  CHOL 102  HDL 32*  LDLCALC 57  TRIG 66  CHOLHDL 3.2   Thyroid function studies No results for input(s): TSH, T4TOTAL, T3FREE, THYROIDAB in the last 72 hours.  Invalid input(s): FREET3 Anemia work up No results for input(s): VITAMINB12, FOLATE, FERRITIN, TIBC, IRON, RETICCTPCT in the last 72 hours.    Time coordinating discharge: Over 30 minutes  SIGNED:  Marcellus Scott, MD, FACP, FHM. [facility] Pager [phone]  If 7PM-7AM, please contact night-coverage [email or web address] Password [email or web address] 02/01/2016, 2:21 PM

## 2016-02-01 NOTE — Discharge Instructions (Signed)
Ischemic Stroke Treated Without Warfarin °An ischemic stroke (cerebrovascular accident) is the sudden death of brain tissue. It is a medical emergency. An ischemic stroke can cause permanent loss of brain function. This can cause problems with different parts of your body. °CAUSES °An ischemic stroke is caused by a decrease of oxygen supply to an area of your brain. It is usually the result of a small blood clot (embolus) or collection of cholesterol or fat (plaque) that blocks blood flow in the brain. An ischemic stroke can also be caused by blocked or damaged carotid arteries. °RISK FACTORS °· High blood pressure (hypertension). °· High cholesterol. °· Diabetes mellitus. °· Heart disease. °· The buildup of plaque in the blood vessels (peripheral artery disease or atherosclerosis). °· The buildup of plaque in the blood vessels that provide blood and oxygen to the brain (carotid artery stenosis). °· An abnormal heart rhythm (atrial fibrillation). °· Obesity. °· Smoking cigarettes. °· Taking oral contraceptives, especially in combination with using tobacco. °· Physical inactivity. °· A diet that is high in fats, salt (sodium), and calories. °· Excessive alcohol use. °· Use of illegal drugs, especially cocaine and methamphetamine. °· Being African American. °· Being over the age of 55 years. °· Family history of stroke. °· Previous history of blood clots, stroke, TIA (transient ischemic attack), or heart attack. °· Sickle cell disease. °SIGNS AND SYMPTOMS °These symptoms usually develop suddenly, or you may notice them after waking up from sleep. Symptoms may include sudden: °· Weakness or numbness in your face, arm, or leg, especially on one side of your body. °· Confusion. °· Trouble speaking (aphasia) or understanding speech. °· Trouble seeing with one or both eyes. °· Trouble walking or difficulty moving your arms or legs. °· Dizziness. °· Loss of balance or coordination. °· Severe headache with no known cause.  The headache is often described as the worst headache ever experienced. °DIAGNOSIS °Your health care provider can often determine the presence or absence of an ischemic stroke based on your symptoms, history, and physical exam. CT (computed tomography) of the brain is usually performed to confirm the stroke, determine causes, and determine stroke severity. Other tests may be done to find the cause of the stroke. These tests may include: °· ECG (electrocardiogram). °· Continuous heart monitoring. °· Echocardiogram. °· Carotid ultrasound. °· MRI. °· A scan of the brain circulation. °· Blood tests. °TREATMENT °It is very important to seek treatment at the first sign of stroke symptoms. Your health care provider may perform the following treatments within 6 hours of the onset of stroke symptoms: °· Medicine to dissolve the blood clot (thrombolytic). °· Inserting a device into the affected artery to remove the blood clot. °These treatments may not be effective if too much time has passed since your stroke symptoms began. Even if you do not know when your symptoms began, get treatment as soon as possible. There are other treatment options that may be given, such as: °· Oxygen. °· IV fluids. °· Medicines to thin the blood (anticoagulants). °· A procedure to widen blocked arteries. °Your treatment will depend on how long you have had your symptoms, the severity of your symptoms, and the cause of your symptoms. °Your health care provider will take measures to prevent short-term and long-term complications of stroke, such as: °· Breathing foreign material into the lungs (aspiration pneumonia). °· Blood clots in the legs. °· Bedsores. °· Falls. °Medicines and dietary changes may be used to help treat and manage risk factors for   stroke, such as diabetes and high blood pressure. °If any of your body's functions were impaired by stroke, you may work with physical, speech, or occupational therapists to help you recover. °HOME CARE  INSTRUCTIONS °· Take medicines only as directed by your health care provider. Follow the directions carefully. Medicines may be used to control risk factors for a stroke. Be sure that you understand all your medicine instructions. °· If swallow studies have determined that your swallowing reflex is present, you should eat healthy foods. Foods may need to be a soft or pureed consistency, or you may need to take small bites in order to avoid aspirating or choking. °· Follow physical activity guidelines as directed by your health care team. °· Do not use any tobacco products, including cigarettes, chewing tobacco, or electronic cigarettes. If you smoke, quit. If you need help quitting, ask your health care provider. °· Limit or stop alcohol use. °· A safe home environment is important to reduce the risk of falls. Your health care provider may arrange for specialists to evaluate your home. Having grab bars in the bedroom and bathroom is often important. Your health care provider may arrange for equipment to be used at home, such as raised toilets and a seat for the shower. °· Ongoing physical, occupational, and speech therapy may be needed to maximize your recovery after a stroke. If you have been advised to use a walker or a cane, use it at all times. Be sure to keep your therapy appointments. °· Keep all follow-up visits with your health care provider. This is very important. This includes any referrals, therapy, rehabilitation, and lab tests. Proper follow-up can prevent another stroke from occurring. °PREVENTION °The risk of a stroke can be decreased by appropriately treating high blood pressure, high cholesterol, diabetes, heart disease, and obesity. It can also be decreased by quitting smoking, limiting alcohol, and staying physically active. °SEEK IMMEDIATE MEDICAL CARE IF: °· You have sudden weakness or numbness in your face, arm, or leg, especially on one side of your body. °· You have sudden confusion. °· You  have sudden trouble speaking (aphasia) or understanding. °· You have sudden trouble seeing with one or both eyes. °· You have sudden trouble walking or difficulty moving your arms or legs. °· You have sudden dizziness. °· You have a sudden loss of balance or coordination. °· You have a sudden, severe headache with no known cause. °· You have a partial or total loss of consciousness. °Any of these symptoms may represent a serious problem that is an emergency. Do not wait to see if the symptoms will go away. Get medical help right away. Call your local emergency services (911 in [place] Do not drive yourself to the hospital. °  °This information is not intended to replace advice given to you by your health care provider. Make sure you discuss any questions you have with your health care provider. °  °Document Released: 03/11/2014 Document Reviewed: 03/11/2014 °Elsevier Interactive Patient Education ©2016 [employer]vier Inc[employer] ° °

## 2016-02-01 NOTE — Progress Notes (Signed)
Patient is discharged from room 545m13 at this time. Alert and in stable condition. IV site d/c'd as well as tele. Instructions read to patient with understanding verbalized. Left unit via wheelchair with all belongings at side.

## 2016-02-01 NOTE — Progress Notes (Signed)
Physical Therapy Treatment Patient Details Name: Trevor Bishop MRN: [id number] DOB: [birth year] Today's Date: 02/01/2016    History of Present Illness Trevor Bishop an 80 y.o.male was admitted with onset of weakness  with numbness and slurred speech. MRI (+) Small focus of acute/early subacute infarction within the PMH includes gout, previous stroke, and arthritis.    PT Comments    Pt progressing toward goals. Performed stairs with single point cane without use of handrail for safety returning home. Pt improving with static and dynamic balance at this time. Continue acute rehab to address balance deficits and endurance.   Follow Up Recommendations  Home health PT;Supervision - Intermittent     Equipment Recommendations  None recommended by PT    Recommendations for Other Services       Precautions / Restrictions Precautions Precautions: Fall Restrictions Weight Bearing Restrictions: No    Mobility  Bed Mobility Overal bed mobility: Needs Assistance Bed Mobility: Supine to Sit     Supine to sit: Supervision     General bed mobility comments: Pt with sore IV location in L arm. Hesistant to WB through LUE, but able to compensate with increased time.  Transfers Overall transfer level: Needs assistance Equipment used: Straight cane Transfers: Sit to/from Stand Sit to Stand: Min guard         General transfer comment: Pt required increased time x 2 attempts to stand due to decreased use of LE to power up.   Ambulation/Gait Ambulation/Gait assistance: Supervision Ambulation Distance (Feet): 125 Feet Assistive device: Straight cane Gait Pattern/deviations: Step-to pattern;Decreased stride length   Gait velocity interpretation: Below normal speed for age/gender General Gait Details: Pt with increased steadiness during ambulation today.   Stairs   Stairs assistance: Min guard Stair Management: With cane Number of Stairs: 3 (+2) General stair comments: Pt  with increased stability on stairs with cane, not requiring handrail today.  Wheelchair Mobility    Modified Rankin (Stroke Patients Only) Modified Rankin (Stroke Patients Only) Pre-Morbid Rankin Score: No symptoms Modified Rankin: Moderate disability     Balance Overall balance assessment: Needs assistance Sitting-balance support: Feet supported;No upper extremity supported Sitting balance-Leahy Scale: Good     Standing balance support: Single extremity supported;During functional activity Standing balance-Leahy Scale: Fair Standing balance comment: SPC for support     Tandem Stance - Right Leg: 10 (Attempted x 3, EO, max stance 10 sec. Modified tandem.  ) Tandem Stance - Left Leg: 30 (Attempted x 2, EO, max stance x 30 sec. Modified tandem.)   Rhomberg - Eyes Closed: 30 (sec)   High Level Balance Comments: Pt required assistance for balance with v/c's to achieve tandem stance with both legs.    Cognition Arousal/Alertness: Awake/alert Behavior During Therapy: WFL for tasks assessed/performed Overall Cognitive Status: Within Functional Limits for tasks assessed Area of Impairment: Safety/judgement         Safety/Judgement: Decreased awareness of safety     General Comments: Pt with decreased awareness of safety with  balance deficits.    Exercises      General Comments General comments (skin integrity, edema, etc.): Pt hesitance to use L UE due to painful IV. States he would be much better with his balance if his arm didn't hurt.      Pertinent Vitals/Pain Pain Assessment: No/denies pain Faces Pain Scale: Hurts little more Pain Location: L forearm IV Pain Descriptors / Indicators: Sore Pain Intervention(s): Monitored during session (RN notified)    Home Living  Prior Function            PT Goals (current goals can now be found in the care plan section) Acute Rehab PT Goals Patient Stated Goal: to go home today PT Goal  Formulation: With patient Time For Goal Achievement: 02/06/16 Potential to Achieve Goals: Good Progress towards PT goals: Progressing toward goals    Frequency  Min 4X/week    PT Plan Current plan remains appropriate    Co-evaluation             End of Session Equipment Utilized During Treatment: Gait belt Activity Tolerance: Patient tolerated treatment well Patient left: in chair;with call bell/phone within reach;with chair alarm set     Time: 0940-1003 PT Time Calculation (min) (ACUTE ONLY): 23 min  Charges:  $Gait Training: 8-22 mins $Neuromuscular Re-education: 8-22 mins                    G Codes:      Dreama SaaCara Ra Pfiester 02/01/2016, 11:25 AM  Park Literara A Laurana Magistro, SPT (student physical therapist) Acute Rehabilitation Services [phone]

## 2016-02-01 NOTE — Progress Notes (Signed)
Occupational Therapy Treatment Patient Details Name: Trevor GhaziJesse R Mo MRN: [id number] DOB: 30-Apr-1936 Today's Date: 02/01/2016    History of present illness [name] an 80 y.o.male was admitted with onset of weakness  with numbness and slurred speech. MRI (+) Small focus of acute/early subacute infarction within the PMH includes gout, previous stroke, and arthritis.   OT comments  Pt able to demo safety with toilet and shower transfers this session with supervision. Educated pt on home safety and fall prevention strategies. Pt able to perform higher level balance activities with supervision and no LOB. D/c plan remains appropriate. Will continue to follow acutely.   Follow Up Recommendations  Home health OT    Equipment Recommendations  None recommended by OT    Recommendations for Other Services      Precautions / Restrictions Precautions Precautions: Fall Restrictions Weight Bearing Restrictions: No       Mobility Bed Mobility Overal bed mobility: Needs Assistance Bed Mobility: Supine to Sit     Supine to sit: Supervision     General bed mobility comments: Pt OOB in chair upon arrival.  Transfers Overall transfer level: Needs assistance Equipment used: Straight cane Transfers: Sit to/from Stand Sit to Stand: Min assist         General transfer comment: Min assist to boost up from chair with increased time required.    Balance Overall balance assessment: Needs assistance Sitting-balance support: Feet supported;No upper extremity supported Sitting balance-Leahy Scale: Good     Standing balance support: Single extremity supported;During functional activity Standing balance-Leahy Scale: Fair Standing balance comment: SPC for support         ADL Overall ADL's : Needs assistance/impaired Eating/Feeding: Set up;Sitting                   Lower Body Dressing: Supervision/safety;Sit to/from stand   Toilet Transfer: Ambulation;Comfort height  toilet;Minimal assistance [facility] Toilet Transfer Details (indicate cue type and reason): Simulated by sit to stand from chair with functional mobility in room. Min assist to boost up from chair.     Tub/ Shower Transfer: Supervision/safety;Walk-in shower;Ambulation [facility])   Functional mobility during ADLs: Supervision/safety;Cane General ADL Comments: Educated pt on home safety and fall prevention strategies. Pt reports wife or grandson will be available to help as needed; encouraged pt to be as independent as possible upon return home. Pt able to perform higher level functional activities with increased time; no LOB noted.      Vision                     Perception     Praxis      Cognition   Behavior During Therapy: [facility] Services[facility]FL for tasks assessed/performed Overall Cognitive Status: Within Functional Limits for tasks assessed Area of Impairment: Safety/judgement          Safety/Judgement: Decreased awareness of safety     General Comments: Pt with decreased awareness of safety with  balance deficits.    Extremity/Trunk Assessment               Exercises     Shoulder Instructions       General Comments      Pertinent Vitals/ Pain       Pain Assessment: No/denies pain Faces Pain Scale: Hurts little more Pain Location: L forearm IV Pain Descriptors / Indicators: Sore Pain Intervention(s): Monitored during session (RN notified)  Home Living  Prior Functioning/Environment              Frequency Min 2X/week     Progress Toward Goals  OT Goals(current goals can now be found in the care plan section)  Progress towards OT goals: Progressing toward goals  Acute Rehab OT Goals Patient Stated Goal: to go home today OT Goal Formulation: With patient ADL Goals Pt Will Transfer to Toilet: ambulating;regular height toilet;with modified independence Pt Will Perform Tub/Shower Transfer: with modified  independence;Tub transfer;ambulating  Plan Discharge plan remains appropriate    Co-evaluation                 End of Session Equipment Utilized During Treatment: Gait belt;Other (comment) (cane)   Activity Tolerance Patient tolerated treatment well   Patient Left in chair;with call bell/phone within reach;with chair alarm set   Nurse Communication          Time: 1107-1118 OT Time [id number] (min): 11 min  Charges: OT General Charges $OT Visit: 1 Procedure OT Treatments $Self Care/Home Management : 8-22 mins  Gaye AlkenBailey A Yates Weisgerber M.S., OTR/L Pager: [phone]  02/01/2016, 11:22 AM

## 2016-02-04 ENCOUNTER — Other Ambulatory Visit: Payer: Self-pay | Admitting: Internal Medicine

## 2016-02-04 NOTE — Progress Notes (Deleted)
Was informed today by patients family who works as Charity fundraiserN at [employer] that patient has not filled Plavix- initially pharmacy stated that they did not have the medication and when he went back today, was told that they did not get the prescription! Even though they received the other 2 scripts that were sent same time. I was asked to call the prescription to a different pharmacy, [facility] on [address] Called Pharmacist Mr. Jean RosenthalJackson with prescription.  Marcellus ScottHONGALGI,Warrick Llera, MD, FACP, FHM. [facility] Pager [phone]  If 7PM-7AM, please contact night-coverage [email or web address] Password [email or web address] 02/04/2016, 1:50 PM

## 2016-02-04 NOTE — Progress Notes (Deleted)
Was informed today by patients family who works as RN at [employer] that patient has not filled Plavix- initially pharmacy stated that they did not have the medication and when he went back today, was told that they did not get the prescription! Even though they received the other 2 scripts that were sent same time. I was asked to call the prescription to a different pharmacy, [facility] on [address]. Called Pharmacist Mr. Jackson with prescription.  Sung Parodi, MD, FACP, FHM. [facility] Pager [phone]  If 7PM-7AM, please contact night-coverage [email or web address] Password [email or web address] 02/04/2016, 1:50 PM  

## 2016-02-13 ENCOUNTER — Other Ambulatory Visit: Payer: Self-pay

## 2016-02-13 NOTE — Patient Outreach (Signed)
[facility] Care Management  02/13/2016  Trevor Bishop 04-10-1936 [id number]   REFERRAL DATE; 02/13/16  REFERRAL SOURCE:  EMMI stroke red alert REFERRAL REASON:  Question/problems with medications, lost interest in things they use to enjoy.  Telephone call to patient regarding EMMI stroke red alert. Unable to reach patient.  HIPAA compliant voice message left with call back phone number.   PLAN; RNCM will attempt 2nd telephone call to patient within  1 week.   George InaDavina Jaise Moser RN,BSN,CCM [facility] Telephonic  [phone]

## 2016-02-14 ENCOUNTER — Other Ambulatory Visit: Payer: Self-pay

## 2016-02-14 NOTE — Patient Outreach (Signed)
[facility] Care Management  02/14/2016  Trevor Bishop May 31, 1936 [id number]   REFERRAL DATE: 04/09/16 REFERRAL SOURCE:  EMMI stroke red alert REFERRAL REASON:  Question/problems regarding medications,  Lost interest in things they used to enjoy  Second telephone call to patient regarding EMMI stroke alert.  Unable to reach.  HIPAA compliant voice message left with call back phone number.   PLAN;  RNCM will attempt 3rd telephone call to patient within 1 week.  George InaDavina Abbygael Curtiss RN,BSN,CCM [facility] Telephonic  [phone]

## 2016-02-16 ENCOUNTER — Other Ambulatory Visit: Payer: Self-pay

## 2016-02-16 NOTE — Patient Outreach (Signed)
[facility] Care Management  02/16/2016  Gwenevere GhaziJesse R Aschoff [birth year] [id number]   REFERRAL DATE: 04/09/16 REFERRAL SOURCE:  EMMI stroke red alert REFERRAL REASON:  Question/problems regarding medications,  Lost interest in things they used to enjoy  Third telephone outreach call to patient regarding EMMI stroke referral.  Unable to reach patient.  Person answering phone states he is not at home.  HIPAA compliant message and call back number left with contact.   PLAN; RNCM will send patient outreach letter to attempt contact.   George InaDavina Estefanny Moler RN,BSN,CCM [facility] Telephonic  [phone]

## 2016-02-19 ENCOUNTER — Other Ambulatory Visit: Payer: Self-pay

## 2016-02-19 NOTE — Patient Outreach (Signed)
[facility] Care Management  02/19/2016  Trevor Bishop [birth year] [id number]   REFERRAL DATE: 04/09/16 REFERRAL SOURCE:  EMMI stroke red alert REFERRAL REASON:  Question/problems regarding medications,  Lost interest in things they used to enjoy  Telephone call to patient regarding EMMI stroke referral.  Unable to reach patient. HIPAA compliant voice message left with call back phone number.  PLAN:  Telephone outreach letter has been mailed to patient on 9/8/ 17.  Will await follow up from patient.  If no response from patient within 10 business days from 9/8/ 17 will close out.  Trevor InaDavina Rosalin Buster RN,BSN,CCM [facility] Telephonic  [phone]

## 2016-03-03 ENCOUNTER — Emergency Department (HOSPITAL_COMMUNITY): Payer: Commercial Managed Care - HMO

## 2016-03-03 ENCOUNTER — Inpatient Hospital Stay (HOSPITAL_COMMUNITY)
Admission: EM | Admit: 2016-03-03 | Discharge: 2016-03-10 | DRG: 296 | Disposition: E | Payer: Commercial Managed Care - HMO | Attending: Pulmonary Disease | Admitting: Pulmonary Disease

## 2016-03-03 ENCOUNTER — Inpatient Hospital Stay (HOSPITAL_COMMUNITY): Payer: Commercial Managed Care - HMO

## 2016-03-03 DIAGNOSIS — G931 Anoxic brain damage, not elsewhere classified: Secondary | ICD-10-CM | POA: Diagnosis not present

## 2016-03-03 DIAGNOSIS — M109 Gout, unspecified: Secondary | ICD-10-CM | POA: Diagnosis present

## 2016-03-03 DIAGNOSIS — Z7902 Long term (current) use of antithrombotics/antiplatelets: Secondary | ICD-10-CM | POA: Diagnosis not present

## 2016-03-03 DIAGNOSIS — I10 Essential (primary) hypertension: Secondary | ICD-10-CM | POA: Diagnosis not present

## 2016-03-03 DIAGNOSIS — G9340 Encephalopathy, unspecified: Secondary | ICD-10-CM | POA: Diagnosis not present

## 2016-03-03 DIAGNOSIS — Z8673 Personal history of transient ischemic attack (TIA), and cerebral infarction without residual deficits: Secondary | ICD-10-CM | POA: Diagnosis not present

## 2016-03-03 DIAGNOSIS — H5500 Unspecified nystagmus: Secondary | ICD-10-CM | POA: Diagnosis present

## 2016-03-03 DIAGNOSIS — I469 Cardiac arrest, cause unspecified: Principal | ICD-10-CM | POA: Diagnosis present

## 2016-03-03 DIAGNOSIS — R451 Restlessness and agitation: Secondary | ICD-10-CM | POA: Diagnosis present

## 2016-03-03 DIAGNOSIS — R569 Unspecified convulsions: Secondary | ICD-10-CM | POA: Diagnosis not present

## 2016-03-03 DIAGNOSIS — R402432 Glasgow coma scale score 3-8, at arrival to emergency department: Secondary | ICD-10-CM | POA: Diagnosis present

## 2016-03-03 DIAGNOSIS — R55 Syncope and collapse: Secondary | ICD-10-CM | POA: Diagnosis present

## 2016-03-03 DIAGNOSIS — R739 Hyperglycemia, unspecified: Secondary | ICD-10-CM | POA: Diagnosis not present

## 2016-03-03 DIAGNOSIS — Z0189 Encounter for other specified special examinations: Secondary | ICD-10-CM

## 2016-03-03 DIAGNOSIS — Z01818 Encounter for other preprocedural examination: Secondary | ICD-10-CM

## 2016-03-03 DIAGNOSIS — J9601 Acute respiratory failure with hypoxia: Secondary | ICD-10-CM | POA: Diagnosis not present

## 2016-03-03 DIAGNOSIS — K7200 Acute and subacute hepatic failure without coma: Secondary | ICD-10-CM | POA: Diagnosis present

## 2016-03-03 DIAGNOSIS — Z7982 Long term (current) use of aspirin: Secondary | ICD-10-CM | POA: Diagnosis not present

## 2016-03-03 DIAGNOSIS — J9600 Acute respiratory failure, unspecified whether with hypoxia or hypercapnia: Secondary | ICD-10-CM | POA: Diagnosis present

## 2016-03-03 DIAGNOSIS — Z79899 Other long term (current) drug therapy: Secondary | ICD-10-CM

## 2016-03-03 DIAGNOSIS — R9401 Abnormal electroencephalogram [EEG]: Secondary | ICD-10-CM | POA: Diagnosis present

## 2016-03-03 DIAGNOSIS — N179 Acute kidney failure, unspecified: Secondary | ICD-10-CM | POA: Diagnosis present

## 2016-03-03 DIAGNOSIS — G253 Myoclonus: Secondary | ICD-10-CM | POA: Diagnosis not present

## 2016-03-03 DIAGNOSIS — Z9289 Personal history of other medical treatment: Secondary | ICD-10-CM

## 2016-03-03 DIAGNOSIS — G4089 Other seizures: Secondary | ICD-10-CM | POA: Diagnosis not present

## 2016-03-03 LAB — I-STAT CHEM 8, ED
BUN: 13 mg/dL (ref 6–20)
Calcium, Ion: 1.14 mmol/L — ABNORMAL LOW (ref 1.15–1.40)
Chloride: 102 mmol/L (ref 101–111)
Creatinine, Ser: 1.1 mg/dL (ref 0.61–1.24)
Glucose, Bld: 278 mg/dL — ABNORMAL HIGH (ref 65–99)
HCT: 44 % (ref 39.0–52.0)
Hemoglobin: 15 g/dL (ref 13.0–17.0)
Potassium: 2.5 mmol/L — CL (ref 3.5–5.1)
Sodium: 145 mmol/L (ref 135–145)
TCO2: 25 mmol/L (ref 0–100)

## 2016-03-03 LAB — URINALYSIS, ROUTINE W REFLEX MICROSCOPIC
BILIRUBIN URINE: NEGATIVE
Glucose, UA: NEGATIVE mg/dL
Hgb urine dipstick: NEGATIVE
Ketones, ur: 15 mg/dL — AB
LEUKOCYTES UA: NEGATIVE
NITRITE: NEGATIVE
PH: 5.5 (ref 5.0–8.0)
Protein, ur: 100 mg/dL — AB

## 2016-03-03 LAB — COMPREHENSIVE METABOLIC PANEL
ALBUMIN: 3.6 g/dL (ref 3.5–5.0)
ALT: 233 U/L — ABNORMAL HIGH (ref 17–63)
ANION GAP: 16 — AB (ref 5–15)
AST: 266 U/L — ABNORMAL HIGH (ref 15–41)
Alkaline Phosphatase: 80 U/L (ref 38–126)
BUN: 11 mg/dL (ref 6–20)
CALCIUM: 8.4 mg/dL — AB (ref 8.9–10.3)
CHLORIDE: 101 mmol/L (ref 101–111)
CO2: 23 mmol/L (ref 22–32)
Creatinine, Ser: 1.34 mg/dL — ABNORMAL HIGH (ref 0.61–1.24)
GFR calc non Af Amer: 49 mL/min — ABNORMAL LOW (ref >60)
GFR, EST AFRICAN AMERICAN: 57 mL/min — AB (ref >60)
GLUCOSE: 289 mg/dL — AB (ref 65–99)
POTASSIUM: 2.5 mmol/L — AB (ref 3.5–5.1)
SODIUM: 140 mmol/L (ref 135–145)
Total Bilirubin: 0.9 mg/dL (ref 0.3–1.2)
Total Protein: 6.8 g/dL (ref 6.5–8.1)

## 2016-03-03 LAB — URINE MICROSCOPIC-ADD ON

## 2016-03-03 LAB — GLUCOSE, CAPILLARY: GLUCOSE-CAPILLARY: 246 mg/dL — AB (ref 65–99)

## 2016-03-03 LAB — CBC WITH DIFFERENTIAL/PLATELET
BASOS PCT: 1 %
Basophils Absolute: 0 10*3/uL (ref 0.0–0.1)
EOS ABS: 0.1 10*3/uL (ref 0.0–0.7)
EOS PCT: 1 %
HCT: 45 % (ref 39.0–52.0)
HEMOGLOBIN: 13.5 g/dL (ref 13.0–17.0)
LYMPHS ABS: 2.9 10*3/uL (ref 0.7–4.0)
Lymphocytes Relative: 44 %
MCH: 26.2 pg (ref 26.0–34.0)
MCHC: 30 g/dL (ref 30.0–36.0)
MCV: 87.2 fL (ref 78.0–100.0)
MONO ABS: 0.3 10*3/uL (ref 0.1–1.0)
MONOS PCT: 4 %
NEUTROS PCT: 50 %
Neutro Abs: 3.3 10*3/uL (ref 1.7–7.7)
PLATELETS: 174 10*3/uL (ref 150–400)
RBC: 5.16 MIL/uL (ref 4.22–5.81)
RDW: 15.3 % (ref 11.5–15.5)
WBC: 6.6 10*3/uL (ref 4.0–10.5)

## 2016-03-03 LAB — I-STAT VENOUS BLOOD GAS, ED
ACID-BASE DEFICIT: 3 mmol/L — AB (ref 0.0–2.0)
BICARBONATE: 21.4 mmol/L (ref 20.0–28.0)
O2 Saturation: 96 %
PH VEN: 7.392 (ref 7.250–7.430)
PO2 VEN: 79 mmHg — AB (ref 32.0–45.0)
TCO2: 23 mmol/L (ref 0–100)
pCO2, Ven: 34.7 mmHg — ABNORMAL LOW (ref 44.0–60.0)

## 2016-03-03 LAB — CK: Total CK: 43 U/L — ABNORMAL LOW (ref 49–397)

## 2016-03-03 LAB — I-STAT CG4 LACTIC ACID, ED: Lactic Acid, Venous: 7.27 mmol/L (ref 0.5–1.9)

## 2016-03-03 LAB — I-STAT TROPONIN, ED: TROPONIN I, POC: 0 ng/mL (ref 0.00–0.08)

## 2016-03-03 LAB — MAGNESIUM: Magnesium: 2.4 mg/dL (ref 1.7–2.4)

## 2016-03-03 LAB — BRAIN NATRIURETIC PEPTIDE: B NATRIURETIC PEPTIDE 5: 54.8 pg/mL (ref 0.0–100.0)

## 2016-03-03 MED ORDER — MAGNESIUM SULFATE 2 GM/50ML IV SOLN
2.0000 g | Freq: Once | INTRAVENOUS | Status: AC
  Administered 2016-03-03: 2 g via INTRAVENOUS
  Filled 2016-03-03: qty 50

## 2016-03-03 MED ORDER — PROPOFOL 1000 MG/100ML IV EMUL
INTRAVENOUS | Status: AC
  Filled 2016-03-03: qty 100

## 2016-03-03 MED ORDER — ASPIRIN 81 MG PO CHEW
324.0000 mg | CHEWABLE_TABLET | ORAL | Status: AC
  Administered 2016-03-04: 324 mg via ORAL
  Filled 2016-03-03: qty 4

## 2016-03-03 MED ORDER — ASPIRIN 300 MG RE SUPP: 300.0000 mg | RECTAL | Status: AC

## 2016-03-03 MED ORDER — DEXTROSE 5 % IV SOLN
0.5000 ug/min | INTRAVENOUS | Status: DC
  Administered 2016-03-03: 0.5 ug/min via INTRAVENOUS
  Filled 2016-03-03: qty 4

## 2016-03-03 MED ORDER — FENTANYL CITRATE (PF) 100 MCG/2ML IJ SOLN
50.0000 ug | Freq: Once | INTRAMUSCULAR | Status: AC
Start: 2016-03-03 — End: 2016-03-03
  Administered 2016-03-03: 50 ug via INTRAVENOUS
  Filled 2016-03-03: qty 2

## 2016-03-03 MED ORDER — KETAMINE HCL-SODIUM CHLORIDE 100-0.9 MG/10ML-% IV SOSY
1.0000 mg/kg | PREFILLED_SYRINGE | Freq: Once | INTRAVENOUS | Status: AC
  Administered 2016-03-03: 90 mg via INTRAVENOUS
  Filled 2016-03-03: qty 10

## 2016-03-03 MED ORDER — POTASSIUM CHLORIDE 10 MEQ/100ML IV SOLN
10.0000 meq | Freq: Once | INTRAVENOUS | Status: AC
  Administered 2016-03-03: 10 meq via INTRAVENOUS
  Filled 2016-03-03: qty 100

## 2016-03-03 MED ORDER — FAMOTIDINE 40 MG/5ML PO SUSR
20.0000 mg | Freq: Two times a day (BID) | ORAL | Status: DC
  Administered 2016-03-04 – 2016-03-08 (×10): 20 mg
  Filled 2016-03-03 (×11): qty 2.5

## 2016-03-03 MED ORDER — PROPOFOL 1000 MG/100ML IV EMUL
5.0000 ug/kg/min | Freq: Once | INTRAVENOUS | Status: AC
  Administered 2016-03-03: 10 ug/kg/min via INTRAVENOUS

## 2016-03-03 MED ORDER — PROPOFOL 1000 MG/100ML IV EMUL
5.0000 ug/kg/min | INTRAVENOUS | Status: DC
  Administered 2016-03-04: 30 ug/kg/min via INTRAVENOUS
  Filled 2016-03-03: qty 100

## 2016-03-03 MED ORDER — NOREPINEPHRINE BITARTRATE 1 MG/ML IV SOLN
2.0000 ug/min | INTRAVENOUS | Status: DC
  Administered 2016-03-03: 5 ug/min via INTRAVENOUS
  Filled 2016-03-03: qty 4

## 2016-03-03 MED ORDER — SODIUM CHLORIDE 0.9 % IV SOLN
Freq: Once | INTRAVENOUS | Status: AC
  Administered 2016-03-03: 20:00:00 via INTRAVENOUS

## 2016-03-03 MED ORDER — SODIUM CHLORIDE 0.9 % IV SOLN
250.0000 mL | INTRAVENOUS | Status: DC | PRN
  Administered 2016-03-04: 250 mL via INTRAVENOUS

## 2016-03-03 MED ORDER — SODIUM CHLORIDE 0.9 % IV SOLN
30.0000 ug/h | INTRAVENOUS | Status: DC
  Administered 2016-03-03: 30 ug/h via INTRAVENOUS
  Filled 2016-03-03: qty 50

## 2016-03-03 NOTE — ED Notes (Signed)
Pt transported to CT prior to going to floor

## 2016-03-03 NOTE — ED Notes (Signed)
Dr Andria MeuseStevens at bedside with family.

## 2016-03-03 NOTE — Progress Notes (Signed)
eLink Physician-Brief Progress Note Patient Name: Trevor Bishop DOB: 1936/01/23 MRN: [id number]   Date of Service  03/06/2016  HPI/Events of Note  Patient currently on Propofol and Fentanyl IV infusions for sedation. No orders for either IV infusion. H&P indicates that Propofol IV infusion is to be continued and Fentanyl IV infusion stopped. Also question if OK to give ASA. My review of Head CT reveals no evidence of  intracranial bleeding.   eICU Interventions  Will order: 1. Propofol IV infusion. Titrate to RASS = -1. 2. OK to give ASA.     Intervention Category Minor Interventions: Agitation / anxiety - evaluation and management  Trevor Bishop,Trevor Bishop 03/09/2016, 11:34 PM

## 2016-03-03 NOTE — ED Provider Notes (Signed)
[facility]-EM[facility]Y DEPT Provider Note   CSN: [id number] Arrival date & time: 02/21/2016  1845     History   Chief Complaint Chief Complaint  Patient presents with  . post arrest    HPI Trevor Bishop is a 80 y.o. male.  The history is provided by the EMS personnel and a relative (step-daughter who witnessed events).  Loss of Consciousness   This is a new problem. The current episode started less than 1 hour ago. The problem occurs constantly. The problem has not changed since onset.He lost consciousness for a period of greater than 5 minutes. The problem is associated with normal activity (sitting down, was complaining of weakness of left side of body prior to onset). Associated symptoms include diaphoresis, vomiting and weakness. Pertinent negatives include seizures. Treatments tried: CPR x 2 by EMS PTA. His past medical history is significant for CVA.    Past Medical History:  Diagnosis Date  . Allergy   . Arthritis   . Gout   . Stroke [facility](HCC)     Patient Active Problem List   Diagnosis Date Noted  . TIA (transient ischemic attack) 01/30/2016  . Right pontine stroke (HCC) 01/30/2016  . Stroke (cerebrum) (HCC) 01/30/2016  . Stroke (HCC) 01/30/2016  . Fever 05/24/2015  . SIRS (systemic inflammatory response syndrome) (HCC) 05/24/2015  . Gout attack 05/24/2015  . Acute gout     Past Surgical History:  Procedure Laterality Date  . EYE SURGERY         Home Medications    Prior to Admission medications   Medication Sig Start Date End Date Taking? Authorizing Provider  aspirin EC 81 MG tablet Take 1 tablet (81 mg total) by mouth daily. 02/01/16   Elease EtienneAnand D Hongalgi, MD  clopidogrel (PLAVIX) 75 MG tablet Take 1 tablet (75 mg total) by mouth daily. 02/01/16   Elease EtienneAnand D Hongalgi, MD  Menthol, Topical Analgesic, (ICY HOT POWER) 16 % GEL Apply 1 application topically daily as needed (general pain / arm).    Historical Provider, MD  naproxen (NAPROSYN) 500 MG tablet Take 1 tablet (500  mg total) by mouth 2 (two) times daily as needed. 2 times daily regularly for 3-5 days then as needed for gouty pain. 02/01/16   Elease EtienneAnand D Hongalgi, MD  omega-3 acid ethyl esters (LOVAZA) 1 g capsule Take 1 g by mouth daily.    Historical Provider, MD  OVER THE COUNTER MEDICATION Take 1 tablet by mouth daily. "Prostratus"    Historical Provider, MD    Family History Family History  Problem Relation Age of Onset  . Gout Neg Hx   . Diabetes Mellitus II Neg Hx     Social History Social History  Substance Use Topics  . Smoking status: Never Smoker  . Smokeless tobacco: Never Used  . Alcohol use No     Allergies   Review of patient's allergies indicates no known allergies.   Review of Systems Review of Systems  Unable to perform ROS: Patient unresponsive  Constitutional: Positive for diaphoresis.  Cardiovascular: Positive for syncope.  Gastrointestinal: Positive for vomiting.  Neurological: Positive for weakness. Negative for seizures.     Physical Exam Updated Vital Signs BP 107/68   Pulse 85   Temp 97 F (36.1 C) (Axillary) Comment (Src): axillary  Resp 16   Ht 6' (1.829 m)   Wt 89.8 kg   SpO2 99%   BMI 26.85 kg/m   Physical Exam  Constitutional: He appears well-developed and well-nourished.  Intubated,  unresponsive, GCS 3  HENT:  Head: Normocephalic and atraumatic.  Right Ear: External ear normal.  Left Ear: External ear normal.  Eyes: Conjunctivae are normal. Pupils are equal, round, and reactive to light. No scleral icterus.  Neck: Neck supple. No tracheal deviation present.  Cardiovascular: Regular rhythm and intact distal pulses.   No murmur heard. Tachycardic with 1+ radial and DP pulses b/l, warm perfused feet   Pulmonary/Chest: Breath sounds normal. No respiratory distress.  Symmetric breath sounds bilaterally  Abdominal: Soft. He exhibits no distension.  Musculoskeletal: He exhibits no edema or deformity.  Neurological: GCS eye subscore is 1. GCS  verbal subscore is 1. GCS motor subscore is 1.  Skin: Skin is warm and dry. No rash noted. He is not diaphoretic. There is pallor.  Nursing note and vitals reviewed.    ED Treatments / Results  Labs (all labs ordered are listed, but only abnormal results are displayed) Labs Reviewed  COMPREHENSIVE METABOLIC PANEL - Abnormal; Notable for the following:       Result Value   Potassium 2.5 (*)    Glucose, Bld 289 (*)    Creatinine, Ser 1.34 (*)    Calcium 8.4 (*)    AST 266 (*)    ALT 233 (*)    GFR calc non Af Amer 49 (*)    GFR calc Af Amer 57 (*)    Anion gap 16 (*)    All other components within normal limits  CK - Abnormal; Notable for the following:    Total CK 43 (*)    All other components within normal limits  I-STAT CHEM 8, ED - Abnormal; Notable for the following:    Potassium 2.5 (*)    Glucose, Bld 278 (*)    Calcium, Ion 1.14 (*)    All other components within normal limits  I-STAT CG4 LACTIC ACID, ED - Abnormal; Notable for the following:    Lactic Acid, Venous 7.27 (*)    All other components within normal limits  MAGNESIUM  CBC WITH DIFFERENTIAL/PLATELET  BLOOD GAS, ARTERIAL  URINALYSIS, ROUTINE W REFLEX MICROSCOPIC (NOT AT [facility])  Rosezena Sensor, ED    EKG  EKG Interpretation None       Radiology Dg Chest Portable 1 View  Result Date: 03/23/16 CLINICAL DATA:  Central line and endotracheal tube placement today. EXAM: PORTABLE CHEST 1 VIEW COMPARISON:  Single-view of the chest 05/24/2015. FINDINGS: Right IJ approach central venous catheter tip projects in the mid to lower superior vena cava. Endotracheal tube tip is in good position at the level of the clavicular heads. NG tube is in the stomach. Pneumothorax. Cardiomegaly without edema is identified. Mild right basilar atelectasis is noted. IMPRESSION: Support tubes and lines projecting good position. Negative for pneumothorax. Streaky right basilar atelectasis. Electronically Signed   By: Drusilla Kanner M.D.   On: March 23, 2016 20:39    Procedures .Central Line Date/Time: 03/04/2016 12:31 AM Performed by: Horald Pollen Authorized by: Horald Pollen   Consent:    Consent obtained:  Emergent situation Pre-procedure details:    Hand hygiene: Hand hygiene performed prior to insertion     Sterile barrier technique: All elements of maximal sterile technique followed     Skin preparation:  2% chlorhexidine and ChloraPrep   Skin preparation agent: Skin preparation agent completely dried prior to procedure   Anesthesia (see MAR for exact dosages):    Anesthesia method:  Local infiltration   Local anesthetic:  Lidocaine 1% w/o epi Procedure details:  Location:  R internal jugular   Site selection rationale:  Cleaner, easier access   Patient position:  Flat   Procedural supplies:  Triple lumen   Catheter size:  7 Fr   Landmarks identified: yes     Ultrasound guidance: yes     Sterile ultrasound techniques: Sterile gel and sterile probe covers were used     Number of attempts:  1   Successful placement: yes   Post-procedure details:    Post-procedure:  Dressing applied and line sutured   Assessment:  Blood return through all ports, free fluid flow, no pneumothorax on x-ray and placement verified by x-ray (18cm at skin)   Patient tolerance of procedure:  Tolerated well, no immediate complications    (including critical care time)  Medications Ordered in ED Medications  EPINEPHrine (ADRENALIN) 4 mg in dextrose 5 % 250 mL (0.016 mg/mL) infusion (5 mcg/min Intravenous Rate/Dose Change 02/26/2016 2031)  fentaNYL (SUBLIMAZE) 2,500 mcg in sodium chloride 0.9 % 250 mL (10 mcg/mL) infusion (30 mcg/hr Intravenous New Bag/Given 03/02/2016 2026)  potassium chloride 10 mEq in 100 mL IVPB (10 mEq Intravenous New Bag/Given 03/09/2016 2028)  potassium chloride 10 mEq in 100 mL IVPB (0 mEq Intravenous Stopped 02/14/2016 2026)  magnesium sulfate IVPB 2 g 50 mL (0 g Intravenous Stopped 02/28/2016 2033)    ketamine 100 mg in normal saline 10 mL (10mg /mL) syringe (90 mg Intravenous Given 03/04/2016 1956)  fentaNYL (SUBLIMAZE) injection 50 mcg (50 mcg Intravenous Given 03/09/2016 1955)  0.9 %  sodium chloride infusion ( Intravenous Stopped 02/23/2016 2032)     Initial Impression / Assessment and Plan / ED Course  I have reviewed the triage vital signs and the nursing notes.  Pertinent labs & imaging results that were available during my care of the patient were reviewed by me and considered in my medical decision making (see chart for details).  Clinical Course   Trevor Bishop is 80 y.o. male with h/o CVA with right-sided deficits 3 weeks ago who was having general fatigue/malaise today, then started complaining of left-sided deficits with general difficulty moving while sitting down, for which step-daughter called EMS. Pt vomited once, became unresponsive, EMS arrived to scene. Upon EMS arrival, pt was unresponsive and without pulse, started CPR with ROSC. Again had loss of pulse, another round of CPR, then ROSC. Pt intubated without sedation or paralytics in field, GCS 3 on arrival to Ed. ETT confirmed. Noted to have K+ 2.5 on iSTAT and diffuse hypokinesis of heart, worse of septal and posterior LV wall. Started on IV Kcl and magnesium. Concern for electrolyte imbalance leading to weakness and dysrhythmia vs new CVA vs re-activation or bleed into previous CVA vs ACS. Additional doses of IV potassium given through central like and epinephrine drip with goal map >65 and for support of cardiac output.   Pt condition, course, and admission were discussed with attending physician Dr. Blane Ohara.  Final Clinical Impressions(s) / ED Diagnoses   Final diagnoses:  None    New Prescriptions New Prescriptions   No medications on file     Horald Pollen, MD 03/04/16 4540    Blane Ohara, MD 03/05/16 1550

## 2016-03-03 NOTE — H&P (Signed)
PULMONARY / CRITICAL CARE MEDICINE   Name: Gwenevere GhaziJesse R Parthasarathy MRN: [id number] DOB: [birth year]    ADMISSION DATE:  02/14/2016 CONSULTATION DATE:    REFERRING MD:  EDP  CHIEF COMPLAINT:  PEA arrest, ? stroke  HISTORY OF PRESENT ILLNESS:   [name] is a 80M who presented to the ED via EMS. His wife reports that he was sitting on the porch and felt funny and told her he though he had had another stroke. She went to call EMS and returned to find him drooling and slumped over. EMS arrived and shortly thereafter he began vomiting. EMS had to suction his mouth / throat. Once they got him in the ambulance, he became bradycardic and had a PEA arrest. He required multiple rounds of epinephrine and apparently had to be resuscitated twice.  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 No other recent symptoms - no fever / chills / nausea / vomiting / diarrhea / cough / shortness of breath / sputum / chest pain. She says he did not complain of chest pain earlier today.   In the ED, ETT placement was confirmed, a RIJ catheter was placed, he was moving around a lot and was started on fentanyl and propofol for sedation. Epinephrine was required to maintain his blood pressure.   PAST MEDICAL HISTORY :  He  has a past medical history of Allergy; Arthritis; Gout; and Stroke (HCC).  PAST SURGICAL HISTORY: He  has a past surgical history that includes Eye surgery.  No Known Allergies  No current facility-administered medications on file prior to encounter.    Current Outpatient Prescriptions on File Prior to Encounter  Medication Sig  . aspirin EC 81 MG tablet Take 1 tablet (81 mg total) by mouth daily.  . clopidogrel (PLAVIX) 75 MG tablet Take 1 tablet (75 mg total) by mouth daily.  . Menthol, Topical Analgesic, (ICY HOT POWER) 16 % GEL Apply 1 application topically daily as needed (general pain / arm).  . naproxen (NAPROSYN) 500 MG tablet Take 1  tablet (500 mg total) by mouth 2 (two) times daily as needed. 2 times daily regularly for 3-5 days then as needed for gouty pain.  Marland Kitchen. omega-3 acid ethyl esters (LOVAZA) 1 g capsule Take 1 g by mouth daily.  Marland Kitchen. OVER THE COUNTER MEDICATION Take 1 tablet by mouth daily. "Prostratus"    FAMILY HISTORY:  His has no family status information on file.   SOCIAL HISTORY: He  reports that he has never smoked. He has never used smokeless tobacco. He reports that he does not drink alcohol.  REVIEW OF SYSTEMS:   Unable to obtain 2/2 intubated state  SUBJECTIVE:    VITAL SIGNS: BP 107/69   Pulse 76   Temp (!) 95.9 F (35.5 C)   Resp 16   Ht 6' (1.829 m)   Wt 89.8 kg (198 lb)   SpO2 100%   BMI 26.85 kg/m   HEMODYNAMICS:    VENTILATOR SETTINGS: Vent Mode: PRVC FiO2 (%):  [40 %] 40 % Set Rate:  [16 bmp] 16 bmp Vt Set:  [500 mL] 500 mL PEEP:  [5 cmH20] 5 cmH20 Plateau Pressure:  [16 cmH20-17 cmH20] 17 cmH20  INTAKE / OUTPUT: No intake/output data recorded.  PHYSICAL EXAMINATION:  General Well nourished, well developed, intubated, sedated  HEENT No gross abnormalities.   Pulmonary Clear to auscultation bilaterally with no wheezes, rales or ronchi. Good effort, symmetrical  expansion.   Cardiovascular Normal rate, regular rhythm. S1, s2. No m/r/g. Distal pulses palpable.  Abdomen Soft, non-tender, non-distended, positive bowel sounds, no palpable organomegaly or masses. Normoresonant to percussion.  Musculoskeletal Normal bulk and tone  Lymphatics No cervical, supraclavicular or axillary adenopathy.   Neurologic PERRL, gaze conjugate, + cough, +gag, spontaneous movement LLE, no withdrawal of LUE, RUE, RLE. upgoing toes bilaterally (on fentanyl, 10mg  propofol)  Skin/Integuement No rash, no cyanosis, no clubbing.      LABS:  BMET  Recent Labs Lab March 19, 2016 1853 03-19-16 1901  NA 145 140  K 2.5* 2.5*  CL 102 101  CO2  --  23  BUN 13 11  CREATININE 1.10 1.34*  GLUCOSE  278* 289*    Electrolytes  Recent Labs Lab March 19, 2016 1901  CALCIUM 8.4*  MG 2.4    CBC  Recent Labs Lab Mar 19, 2016 1853 2016-03-19 1901  WBC  --  6.6  HGB 15.0 13.5  HCT 44.0 45.0  PLT  --  174    Coag's No results for input(s): APTT, INR in the last 168 hours.  Sepsis Markers  Recent Labs Lab 19-Mar-2016 1853  LATICACIDVEN 7.27*    ABG No results for input(s): PHART, PCO2ART, PO2ART in the last 168 hours.  Liver Enzymes  Recent Labs Lab 03-19-2016 1901  AST 266*  ALT 233*  ALKPHOS 80  BILITOT 0.9  ALBUMIN 3.6    Cardiac Enzymes No results for input(s): TROPONINI, PROBNP in the last 168 hours.  Glucose No results for input(s): GLUCAP in the last 168 hours.  Imaging Dg Chest Port 1 View  Result Date: 03/19/2016 CLINICAL DATA:  80 year old male with trauma and status post intubation. EXAM: PORTABLE CHEST 1 VIEW COMPARISON:  Chest radiograph dated 05/24/2015 and abdominal radiograph Mar 19, 2016. FINDINGS: Endotracheal tube the tip approximately 4 cm above the carina. An enteric tube courses into the left upper abdomen and is only partially visualized on this chest radiograph. There are bibasilar atelectatic changes of the lungs. There is no pleural effusion or pneumothorax. There is stable cardiomegaly with mild central vascular prominence, possibly of congestive changes. Slightly increased left lung interstitial markings may be related to atelectatic changes or underlying contusion is not excluded. No acute osseous pathology identified. IMPRESSION: Endotracheal tube above the carina. Cardiomegaly. Atelectatic changes. Electronically Signed   By: Elgie Collard M.D.   On: 03/19/2016 21:33   Dg Chest Portable 1 View  Result Date: 2016-03-19 CLINICAL DATA:  Central line and endotracheal tube placement today. EXAM: PORTABLE CHEST 1 VIEW COMPARISON:  Single-view of the chest 05/24/2015. FINDINGS: Right IJ approach central venous catheter tip projects in the mid to lower  superior vena cava. Endotracheal tube tip is in good position at the level of the clavicular heads. NG tube is in the stomach. Pneumothorax. Cardiomegaly without edema is identified. Mild right basilar atelectasis is noted. IMPRESSION: Support tubes and lines projecting good position. Negative for pneumothorax. Streaky right basilar atelectasis. Electronically Signed   By: Drusilla Kanner M.D.   On: 03-19-2016 20:39   Dg Abd Portable 1v  Result Date: 03/19/2016 CLINICAL DATA:  80 year old male with enteric tube placement. EXAM: PORTABLE ABDOMEN - 1 VIEW COMPARISON:  None. FINDINGS: An enteric tube is partially visualized in the upper abdominal region likely within the stomach. The tube appears to extend into the distal stomach and bulging single U-turn with tip positioned in the proximal stomach. There is moderate stool throughout the colon. There is no bowel dilatation or evidence of  obstruction. No free air identified. No radiopaque calculi. Prostate brachytherapy seeds noted. There is degenerative changes of the spine and hips. No acute fracture. IMPRESSION: Enteric tube with tip and side-port in the stomach. Electronically Signed   By: Elgie Collard M.D.   On: 31-Mar-2016 21:30     STUDIES:  CT head pending  CULTURES: n/a  ANTIBIOTICS: none  SIGNIFICANT EVENTS:   LINES/TUBES: ETT 9/24 >> RIJ CVC 9/24 >> Foley 9/24 >>  DISCUSSION: Mr. Fells is a 71M with prior history of stroke who presented to the ED after experiencing a cardiac arrest. He was on the porch, felt strange, and told his wife he thought he had had another stroke. EMS was called and after their arrival he started vomiting, had to be suctioned, then became brady and went PEA. The story is suggestive of a primary neurologic event followed by massive aspiration and PEA arrest. After arrival to the ED he was moving spontaneously and required a good bit of sedation. I did not feel that cooling was appropriate in this setting.    ASSESSMENT / PLAN:  PULMONARY A: Acute respiratory failure requiring intubation Probable aspiration event P:   Continue ventilatory support Wean as tolerated  SBT when able No antibiotics as yet; low threshold to add if he clinically decompensates  CARDIOVASCULAR A:  Cardiac arrest (brady to PEA); EKG unchanged since 01/2016 (t-wave inversion in V1 persists) Shock  P:  Trend troponin No cooling TTE  Change epi to levo Maintain MAP > 65 Trend lactate  NEUROLOGIC A:   Concern for acute stroke  Recent pontine stroke on ASA/Plavix P:   RASS goal: -1 Obtain head CT Neuro consult D/c fentanyl so as not to impede neuro exam Continue propofol for sedation Hold antiplatelet therapy pending head CT  RENAL A:   Acute kidney injury (Cr 1.34 from 0.93 8/22) P:   Trend Cr Fluid resuscitate I/Os Avoid nephrotoxins Maintain MAP > 65  GASTROINTESTINAL A:   Shock liver P:   NPO Stress ulcer ppx Trend LFTs  HEMATOLOGIC A:   No acute issues  P:  Exclude hemorrhagic stroke prior to starting dvt ppx  INFECTIOUS A:   No acute issues Probable aspiration event P:   No antibiotics at this time  ENDOCRINE A:   No acute issues P:   Monitor CBG SSI if needed   FAMILY  - Updates: wife, son, nephew, grandchildren at bedside. Wife defers to patient's children regarding code status. Son wishes to speak with his sister in person before making a decision. Patient to remain FULL CODE for now.   - Inter-disciplinary family meet or Palliative Care meeting due by:  day 7  The patient is critically ill with multiple organ system failure and requires high complexity decision making for assessment and support, frequent evaluation and titration of therapies, advanced monitoring, review of radiographic studies and interpretation of complex data.   Critical Care Time devoted to patient care services, exclusive of separately billable procedures, described in this note is 65  minutes.   Nita Sickle, MD Pulmonary and Critical Care Medicine [facility] Pager: [phone]  03/31/16, 9:46 PM

## 2016-03-03 NOTE — ED Notes (Signed)
No cooling at this time per CCM

## 2016-03-03 NOTE — ED Notes (Signed)
Pt with movement to L leg

## 2016-03-03 NOTE — Code Documentation (Signed)
Pt to ED via [facility] from home with family stating pt having left sided deficits -- pt had recent stroke 1 month ago with right hand tremors. While EMS was at house, pt vomited, slumped, became unresponsive, bradycardic in ambulance-- CPR started, received a total of 9 epi's,  On arrival pt was paced at a rate of 60 at . Transferred to ED stretcher, pacer pads changed to zoll-- pacer turned on to rate of 60 at 120 mAmp. Pt had pulse, heart rate in 80's RSR.  Pt was intubated in field-- 7.0 ett, taped at 23 at lip.

## 2016-03-03 NOTE — ED Notes (Signed)
[facility] to [facility]avitz regarding cooling measures for post arrest. Awaiting CCM consult for decision, Core temp 44F

## 2016-03-04 ENCOUNTER — Inpatient Hospital Stay (HOSPITAL_COMMUNITY): Payer: Commercial Managed Care - HMO

## 2016-03-04 DIAGNOSIS — I469 Cardiac arrest, cause unspecified: Principal | ICD-10-CM

## 2016-03-04 DIAGNOSIS — J9601 Acute respiratory failure with hypoxia: Secondary | ICD-10-CM

## 2016-03-04 DIAGNOSIS — G9340 Encephalopathy, unspecified: Secondary | ICD-10-CM

## 2016-03-04 LAB — CBC
HEMATOCRIT: 39.7 % (ref 39.0–52.0)
Hemoglobin: 12.2 g/dL — ABNORMAL LOW (ref 13.0–17.0)
MCH: 25.8 pg — ABNORMAL LOW (ref 26.0–34.0)
MCHC: 30.7 g/dL (ref 30.0–36.0)
MCV: 83.9 fL (ref 78.0–100.0)
PLATELETS: 140 10*3/uL — AB (ref 150–400)
RBC: 4.73 MIL/uL (ref 4.22–5.81)
RDW: 15.2 % (ref 11.5–15.5)
WBC: 7.1 10*3/uL (ref 4.0–10.5)

## 2016-03-04 LAB — BASIC METABOLIC PANEL
Anion gap: 16 — ABNORMAL HIGH (ref 5–15)
BUN: 13 mg/dL (ref 6–20)
CALCIUM: 8.1 mg/dL — AB (ref 8.9–10.3)
CO2: 20 mmol/L — ABNORMAL LOW (ref 22–32)
CREATININE: 1.35 mg/dL — AB (ref 0.61–1.24)
Chloride: 102 mmol/L (ref 101–111)
GFR, EST AFRICAN AMERICAN: 57 mL/min — AB (ref >60)
GFR, EST NON AFRICAN AMERICAN: 49 mL/min — AB (ref >60)
Glucose, Bld: 308 mg/dL — ABNORMAL HIGH (ref 65–99)
Potassium: 2.5 mmol/L — CL (ref 3.5–5.1)
SODIUM: 138 mmol/L (ref 135–145)

## 2016-03-04 LAB — GLUCOSE, CAPILLARY
GLUCOSE-CAPILLARY: 136 mg/dL — AB (ref 65–99)
GLUCOSE-CAPILLARY: 154 mg/dL — AB (ref 65–99)
GLUCOSE-CAPILLARY: 140 mg/dL — AB (ref 65–99)
Glucose-Capillary: 126 mg/dL — ABNORMAL HIGH (ref 65–99)
Glucose-Capillary: 124 mg/dL — ABNORMAL HIGH (ref 65–99)
Glucose-Capillary: 114 mg/dL — ABNORMAL HIGH (ref 65–99)

## 2016-03-04 LAB — MAGNESIUM: Magnesium: 2.2 mg/dL (ref 1.7–2.4)

## 2016-03-04 LAB — TROPONIN I
TROPONIN I: 0.62 ng/mL — AB (ref <0.03)
Troponin I: 0.32 ng/mL (ref <0.03)

## 2016-03-04 LAB — MRSA PCR SCREENING: MRSA by PCR: NEGATIVE

## 2016-03-04 LAB — COMPREHENSIVE METABOLIC PANEL
ALT: 201 U/L — ABNORMAL HIGH (ref 17–63)
AST: 182 U/L — ABNORMAL HIGH (ref 15–41)
Albumin: 3.2 g/dL — ABNORMAL LOW (ref 3.5–5.0)
Alkaline Phosphatase: 69 U/L (ref 38–126)
Anion gap: 7 (ref 5–15)
BUN: 13 mg/dL (ref 6–20)
CO2: 24 mmol/L (ref 22–32)
Calcium: 8.4 mg/dL — ABNORMAL LOW (ref 8.9–10.3)
Chloride: 107 mmol/L (ref 101–111)
Creatinine, Ser: 1.09 mg/dL (ref 0.61–1.24)
GFR calc Af Amer: >60 mL/min (ref >60)
GFR calc non Af Amer: >60 mL/min (ref >60)
Glucose, Bld: 128 mg/dL — ABNORMAL HIGH (ref 65–99)
Potassium: 4 mmol/L (ref 3.5–5.1)
Sodium: 138 mmol/L (ref 135–145)
Total Bilirubin: 1.1 mg/dL (ref 0.3–1.2)
Total Protein: 5.8 g/dL — ABNORMAL LOW (ref 6.5–8.1)

## 2016-03-04 LAB — URINALYSIS, ROUTINE W REFLEX MICROSCOPIC
GLUCOSE, UA: NEGATIVE mg/dL
KETONES UR: 15 mg/dL — AB
Nitrite: POSITIVE — AB
PROTEIN: 30 mg/dL — AB
Specific Gravity, Urine: 1.025 (ref 1.005–1.030)
pH: 6.5 (ref 5.0–8.0)

## 2016-03-04 LAB — URINE MICROSCOPIC-ADD ON

## 2016-03-04 LAB — LACTIC ACID, PLASMA: LACTIC ACID, VENOUS: 6.9 mmol/L — AB (ref 0.5–1.9)

## 2016-03-04 LAB — PHOSPHORUS: PHOSPHORUS: 1.5 mg/dL — AB (ref 2.5–4.6)

## 2016-03-04 MED ORDER — CHLORHEXIDINE GLUCONATE 0.12% ORAL RINSE (MEDLINE KIT)
15.0000 mL | Freq: Two times a day (BID) | OROMUCOSAL | Status: DC
  Administered 2016-03-05 – 2016-03-08 (×7): 15 mL via OROMUCOSAL

## 2016-03-04 MED ORDER — ROCURONIUM BROMIDE 50 MG/5ML IV SOLN
50.0000 mg | Freq: Once | INTRAVENOUS | Status: AC
  Administered 2016-03-04: 50 mg via INTRAVENOUS

## 2016-03-04 MED ORDER — INSULIN ASPART 100 UNIT/ML ~~LOC~~ SOLN
2.0000 [IU] | SUBCUTANEOUS | Status: DC
  Administered 2016-03-04 – 2016-03-05 (×2): 4 [IU] via SUBCUTANEOUS
  Administered 2016-03-05 – 2016-03-08 (×13): 2 [IU] via SUBCUTANEOUS

## 2016-03-04 MED ORDER — ORAL CARE MOUTH RINSE
15.0000 mL | Freq: Four times a day (QID) | OROMUCOSAL | Status: DC
  Administered 2016-03-04 (×3): 15 mL via OROMUCOSAL

## 2016-03-04 MED ORDER — ORAL CARE MOUTH RINSE
15.0000 mL | OROMUCOSAL | Status: DC
  Administered 2016-03-04 – 2016-03-08 (×37): 15 mL via OROMUCOSAL

## 2016-03-04 MED ORDER — POTASSIUM CHLORIDE 10 MEQ/50ML IV SOLN
10.0000 meq | INTRAVENOUS | Status: AC
  Administered 2016-03-04 (×6): 10 meq via INTRAVENOUS
  Filled 2016-03-04 (×6): qty 50

## 2016-03-04 MED ORDER — CHLORHEXIDINE GLUCONATE 0.12% ORAL RINSE (MEDLINE KIT)
15.0000 mL | Freq: Two times a day (BID) | OROMUCOSAL | Status: DC
  Administered 2016-03-04 (×2): 15 mL via OROMUCOSAL

## 2016-03-04 MED ORDER — CHLORHEXIDINE GLUCONATE 0.12 % MT SOLN
OROMUCOSAL | Status: AC
  Administered 2016-03-04: 15 mL
  Filled 2016-03-04: qty 15

## 2016-03-04 MED ORDER — VITAL HIGH PROTEIN PO LIQD
1000.0000 mL | ORAL | Status: DC
  Administered 2016-03-04 – 2016-03-05 (×3): 1000 mL

## 2016-03-04 NOTE — Progress Notes (Signed)
eLink Physician-Brief Progress Note Patient Name: Trevor GhaziJesse R Bishop DOB: [birth year] MRN: [id number]   Date of Service  03/04/2016  HPI/Events of Note  Multiple issues: 1. K+ = 2.5 and Creatinine = 1.35, 2. Lactic Acid = 6.9 - post cardiac arrest. Hgb = 13.5, and 3. Troponin = 0.35. Already on ASA.  eICU Interventions  Will order: 1. Replace K+. 2. Monitor CVP. 3. If CVP > 10, will obtain COOX. 4. Continue to trend Troponin and Lactic Acid.       Intervention Category Major Interventions: Acid-Base disturbance - evaluation and management Intermediate Interventions: Diagnostic test evaluation;Electrolyte abnormality - evaluation and management  Sommer,Steven Eugene 03/04/2016, 12:30 AM

## 2016-03-04 NOTE — Progress Notes (Signed)
Initial Nutrition Assessment  INTERVENTION:   Start Vital High Protein @ 60 ml/hr (1440 ml per day) Provides: 1440 kcal, 126 grams protein, and 1203 ml H2O.  TF regimen and propofol at current rate providing 1867 total kcal/day (103 % of kcal needs)  NUTRITION DIAGNOSIS:   Inadequate oral intake related to inability to eat as evidenced by NPO status.  GOAL:   Patient will meet greater than or equal to 90% of their needs  MONITOR:   TF tolerance, I & O's, Labs, Vent status  REASON FOR ASSESSMENT:   Consult Enteral/tube feeding initiation and management  ASSESSMENT:   Pt with hx of recent stroke (8/17) admitted after vomiting and PEA arrest. Pt not cooled.    Patient is currently intubated on ventilator support MV: 10.2 L/min Temp (24hrs), Avg:96.1 F (35.6 C), Min:93 F (33.9 C), Max:98.1 F (36.7 C)  Propofol: 16.2 ml/hr provides: 427 kcal per day from lipid  Labs reviewed: PO4: 1.5 NG tube in stomach Nutrition-Focused physical exam completed. Findings are no fat depletion, mild/moderate muscle depletion, and no edema.  Per wife and daughters pt would not follow diet he was supposed to be following. Typically ate 2 meals per day and junk food as snacks.   Diet Order:  Diet NPO time specified  Skin:  Reviewed, no issues  Last BM:  unknown  Height:   Ht Readings from Last 1 Encounters:  11-14-15 6' (1.829 m)    Weight:   Wt Readings from Last 1 Encounters:  03/04/16 191 lb 5.8 oz (86.8 kg)    Ideal Body Weight:  80.9 kg  BMI:  Body mass index is 25.95 kg/m.  Estimated Nutritional Needs:   Kcal:  1804  Protein:  104-115 grams  Fluid:  > 1.8 L/day  EDUCATION NEEDS:   No education needs identified at this time  Kendell BaneHeather Bernyce Brimley RD, LDN, CNSC [phone] Pager [phone] After Hours Pager

## 2016-03-04 NOTE — Progress Notes (Signed)
eLink Physician-Brief Progress Note Patient Name: Trevor GhaziJesse R Bishop DOB: [birth year] MRN: [id number]   Date of Service  03/04/2016  HPI/Events of Note  Evolving hyperglycemia  eICU Interventions  SSI protocol started     Intervention Category Intermediate Interventions: Hyperglycemia - evaluation and treatment  BYRUM,ROBERT S. 03/04/2016, 8:49 PM

## 2016-03-04 NOTE — Progress Notes (Signed)
eLink Physician-Brief Progress Note Patient Name: Trevor GhaziJesse R Bishop DOB: 08/18/1935 MRN: [id number]   Date of Service  03/04/2016  HPI/Events of Note  Notified that pt has slight asymmetry pupils 2-33mm with sluggish reaction. Head ct reassuring. Only antiplt has been ASA. Will follow neuro exam, pupillary exam. Certainly repeat Ct if dilation occurs. Otherwise unclear that there is any intervention to make at this time.   eICU Interventions       Intervention Category Intermediate Interventions: Other:  [name] S. 03/04/2016, 9:52 PM

## 2016-03-04 NOTE — Consult Note (Signed)
Neurology Consult Note  Reason for Consultation: Prognosis following cardiac arrest  Requesting provider: Jennet Maduro, MD  CC: None--patient is currently intubated and poorly responsive  HPI: This is a 80 year old man admitted after cardiac arrest. History is obtained from reviewing the medical record as the patient is presently unable to provide any information due to his clinical status. No family is present at the bedside.  The patient and his wife are apparently sitting on the porch when he stated that he felt funny and told his wife the thought he may have had another stroke. She went inside to call 911 and when she came back and found him drooling and slumped over. When EMS arrived, he began vomiting. They transferred him to the ambulance where he became unresponsive and bradycardic followed by a PE arrest. ACLS was initiated with brief return of pulses before he lost them again requiring ongoing CPR and a total of 9 rounds of epinephrine. He was intubated in the field. External pacing was applied and he was transported to the [facility] ED. Examination in the ED documented an unresponsive patient with GCS of 3. Hypothermia was deferred. He was noted to have movement in the ED and was and sedated with propofol and fentanyl. Epinephrine was required to maintain adequate blood pressure. He was admitted by the critical care team with the admission exam documenting reactive pupils, intact cough and gag, spontaneous movement of the left lower extremity but no withdrawal of the remaining limbs. He was admitted to the ICU. His fentanyl drip was stopped at approximately 2340 on 02/24/2016. He has remained on and off of propofol. Neurology consultation is now requested for prognostication following cardiac arrest.   PMH:  Past Medical History:  Diagnosis Date  . Allergy   . Arthritis   . Gout   . Stroke [facility])     PSH:  Past Surgical History:  Procedure Laterality Date  . EYE SURGERY       Family history: Family History  Problem Relation Age of Onset  . Gout Neg Hx   . Diabetes Mellitus II Neg Hx     Social history:  Social History   Social History  . Marital status: Married    Spouse name: N/A  . Number of children: N/A  . Years of education: N/A   Occupational History  . Not on file.   Social History Main Topics  . Smoking status: Never Smoker  . Smokeless tobacco: Never Used  . Alcohol use No  . Drug use: Unknown  . Sexual activity: Not on file   Other Topics Concern  . Not on file   Social History Narrative  . No narrative on file    Current outpatient meds: Current Meds  Medication Sig  . aspirin EC 81 MG tablet Take 1 tablet (81 mg total) by mouth daily.  . clopidogrel (PLAVIX) 75 MG tablet Take 1 tablet (75 mg total) by mouth daily.  . Menthol, Topical Analgesic, (ICY HOT POWER) 16 % GEL Apply 1 application topically daily as needed (general pain / arm).  . naproxen (NAPROSYN) 500 MG tablet Take 1 tablet (500 mg total) by mouth 2 (two) times daily as needed. 2 times daily regularly for 3-5 days then as needed for gouty pain.  Marland Kitchen omega-3 acid ethyl esters (LOVAZA) 1 g capsule Take 1 g by mouth daily.  Marland Kitchen OVER THE COUNTER MEDICATION Take 1 tablet by mouth daily. "Prostratus"    Current inpatient meds:  Current Facility-Administered Medications  Medication Dose Route Frequency Provider Last Rate Last Dose  . 0.9 %  sodium chloride infusion  250 mL Intravenous PRN Dannielle Burn, MD 10 mL/hr at 03/04/16 0700 250 mL at 03/04/16 0700  . chlorhexidine gluconate (MEDLINE KIT) (PERIDEX) 0.12 % solution 15 mL  15 mL Mouth Rinse BID Javier Glazier, MD   15 mL at 03/04/16 0800  . famotidine (PEPCID) 40 MG/5ML suspension 20 mg  20 mg Per Tube BID Dannielle Burn, MD   20 mg at 03/04/16 1320  . feeding supplement (VITAL HIGH PROTEIN) liquid 1,000 mL  1,000 mL Per Tube Continuous Rush Farmer, MD      . MEDLINE mouth rinse  15 mL Mouth  Rinse QID Javier Glazier, MD   15 mL at 03/04/16 1200  . norepinephrine (LEVOPHED) 4 mg in dextrose 5 % 250 mL (0.016 mg/mL) infusion  2-50 mcg/min Intravenous Continuous Dannielle Burn, MD   Stopped at 03/04/16 [phone]  . propofol (DIPRIVAN) 1000 MG/100ML infusion  5-70 mcg/kg/min Intravenous Titrated Anders Simmonds, MD 16.2 mL/hr at 03/04/16 0900 30 mcg/kg/min at 03/04/16 0900    Allergies: No Known Allergies  ROS: As per HPI. This cannot be obtained as the patient is currently intubated and unable to provide.  PE:  BP (!) 112/58   Pulse 78   Temp 97.5 F (36.4 C) (Core (Comment))   Resp 16   Ht 6' (1.829 m)   Wt 86.8 kg (191 lb 5.8 oz)   SpO2 100%   BMI 25.95 kg/m   General: WD African-American man lying in ICU bed, intubated. Sedation is currently off. He is unresponsive to verbal, tactile, and noxious stimulation. He does not open his eyes spontaneously or with stimulation. He does not follow commands.  HEENT: Normocephalic. Neck supple without LAD. RIJ TLC in place. ETT and OGT in place.  CV: Regular, no murmur. Carotid pulses full and symmetric, no bruits. Distal pulses 1+ and symmetric.  Lungs: CTAB on anterior exam.  Abdomen: Soft, non-distended, non-tender. Bowel sounds hypoactive.  Extremities: No C/C/E. Neuro:  CN: Pupils are equal and round. They are symmetrically reactive from 2-->1 mm. He does not blink to visual threat. He has some roving conjugate horizontal eye movements. Oculocephalics are intact but weak. He has a brisk corneal on the left with trace corneal on the right. His face appears grossly symmetric but is partly obscured by tubes and tape. The remainder of his cranial nerve exam is limited by his inability to participate with the exam.   Motor: Normal bulk. Tone is increased on the left. No purposeful or spontaneous movement is seen during my exam. No tremor or other abnormal movements. Sensation: He has flexion withdrawal to noxious stimulation in  BLE, brisker on the left. He has some shoulder abduction with extension of the arm to nailbed pressure on the LUE. There is minimal response to nailbed pressure in the RUE.  DTRs: 3+, brisker on the L. Toes upgoing bilaterally.  Coordination and gait: These cannot be assessed as the patient is unable to participate with the exam.   Labs:  Lab Results  Component Value Date   WBC 7.1 03/04/2016   HGB 12.2 (L) 03/04/2016   HCT 39.7 03/04/2016   PLT 140 (L) 03/04/2016   GLUCOSE 128 (H) 03/04/2016   CHOL 102 01/30/2016   TRIG 66 01/30/2016   HDL 32 (L) 01/30/2016   LDLCALC 57 01/30/2016   ALT 201 (H) 03/04/2016   AST  182 (H) 03/04/2016   NA 138 03/04/2016   K 4.0 03/04/2016   CL 107 03/04/2016   CREATININE 1.09 03/04/2016   BUN 13 03/04/2016   CO2 24 03/04/2016   INR 1.13 01/29/2016   HGBA1C 5.7 (H) 01/30/2016   UA: cloudy, trace hgb, mod bilirubin, ketones 15, protein 30, positive nitrite, small leukocytes, 6-30 wbcs AST 266-->182 ALT 233-->201 Lactate 6.9 Creatinine 1.35-->1.09  Imaging:  I have personally and independently reviewed the [facility] without contrast from 02/13/2016. This shows moderate diffuse chronic small vessel ischemic disease in the bihemispheric white matter with moderate diffuse generalized atrophy. No obvious acute abnormality is noted.    Assessment and Plan:  1. Anoxic brain injury: This is acute, due to cardiac arrest. Initial rhythm was noted to be PEA. He had immediate resuscitative efforts by EMS as the arrest occurred in their presence. Treatment at this point is supportive as noted below. He is presently less than 24 hours out from his resuscitation. He did not receive therapeutic hypothermia.  2. 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 The current examination shows reactive pupils, intact corneals (the right is weaker than left), and flexion withdrawal to pain. Continue supportive care. Avoid hypoxemia and  hypotension for even brief intervals as these are associated with worse neurologic outcomes. Fever and hyperglycemia must be aggressively treated for the same reason. We will continue to follow the exam to aid with neurologic prognosis. Current examination at less than 24 hours post arrest would suggest possibility of a meaningful neurologic recovery given intact corneal reflexes and reactive pupils.  No family was present at the bedside at the time of my visit.  Thank you for the opportunity participate in this patient's care. Please feel free to call with any questions or concerns. Neurology will continue to follow with you.  This patient is critically ill and at significant risk of neurological worsening, death and care requires constant monitoring of vital signs, hemodynamics,respiratory and cardiac monitoring, neurological assessment, discussion with family, other specialists and medical decision making of high complexity. A total of 50 minutes of critical care time was spent on this case.

## 2016-03-04 NOTE — Progress Notes (Signed)
PULMONARY / CRITICAL CARE MEDICINE   Name: Trevor Bishop MRN: [id number] DOB: 1936-04-27    ADMISSION DATE:  2016-03-10 CONSULTATION DATE:    REFERRING MD:  EDP  CHIEF COMPLAINT:  PEA arrest, ? stroke  HISTORY OF PRESENT ILLNESS:   [name] is a 80M who presented to the ED via EMS. His wife reports that he was sitting on the porch and felt funny and told her he though he had had another stroke. She went to call EMS and returned to find him drooling and slumped over. EMS arrived and shortly thereafter he began vomiting. EMS had to suction his mouth / throat. Once they got him in the ambulance, he became bradycardic and had a PEA arrest. He required multiple rounds of epinephrine and apparently had to be resuscitated twice.  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 No other recent symptoms - no fever / chills / nausea / vomiting / diarrhea / cough / shortness of breath / sputum / chest pain. She says he did not complain of chest pain earlier today.   In the ED, ETT placement was confirmed, a RIJ catheter was placed, he was moving around a lot and was started on fentanyl and propofol for sedation. Epinephrine was required to maintain his blood pressure.   SUBJECTIVE:  ETT obstructed, unable to push suction catheter.  VITAL SIGNS: BP (!) 93/54 (BP Location: Left Arm)   Pulse 72   Temp 97.5 F (36.4 C) (Core (Comment))   Resp 16   Ht 6' (1.829 m)   Wt 86.8 kg (191 lb 5.8 oz)   SpO2 100%   BMI 25.95 kg/m   HEMODYNAMICS: CVP:  [8 mmHg-10 mmHg] 8 mmHg  VENTILATOR SETTINGS: Vent Mode: PRVC FiO2 (%):  [40 %] 40 % Set Rate:  [16 bmp] 16 bmp Vt Set:  [500 mL-620 mL] 620 mL PEEP:  [5 cmH20] 5 cmH20 Plateau Pressure:  [16 cmH20-24 cmH20] 19 cmH20  INTAKE / OUTPUT: I/O last 3 completed shifts: In: 2317.6 [I.V.:1867.6; IV Piggyback:450] Out: 550 [Urine:350; Emesis/NG output:200]  PHYSICAL EXAMINATION:  General Well  nourished, well developed, intubated, sedated  HEENT No gross abnormalities.   Pulmonary Clear to auscultation bilaterally with no wheezes, rales or ronchi. Good effort, symmetrical expansion.   Cardiovascular Normal rate, regular rhythm. S1, s2. No m/r/g. Distal pulses palpable.  Abdomen Soft, non-tender, non-distended, positive bowel sounds, no palpable organomegaly or masses. Normoresonant to percussion.  Musculoskeletal Normal bulk and tone  Lymphatics No cervical, supraclavicular or axillary adenopathy.   Neurologic PERRL, gaze conjugate, + cough, +gag, spontaneous movement LLE, no withdrawal of LUE, RUE, RLE. upgoing toes bilaterally (on fentanyl, 10mg  propofol)  Skin/Integuement No rash, no cyanosis, no clubbing.    LABS:  BMET  Recent Labs Lab 2016/03/10 1901 03/10/16 2347 03/04/16 0500  NA 140 138 138  K 2.5* 2.5* 4.0  CL 101 102 107  CO2 23 20* 24  BUN 11 13 13   CREATININE 1.34* 1.35* 1.09  GLUCOSE 289* 308* 128*   Electrolytes  Recent Labs Lab 10-Mar-2016 1901 2016-03-10 2347 03/04/16 0500 03/04/16 0539  CALCIUM 8.4* 8.1* 8.4*  --   MG 2.4  --   --  2.2  PHOS  --   --   --  1.5*   CBC  Recent Labs Lab 2016-03-10 1853 03/10/16 1901 03/04/16 0539  WBC  --  6.6 7.1  HGB 15.0 13.5 12.2*  HCT 44.0 45.0 39.7  PLT  --  174 140*   Coag's No results for input(s): APTT, INR in the last 168 hours.  Sepsis Markers  Recent Labs Lab 03/06/2016 1853 02/24/2016 2300  LATICACIDVEN 7.27* 6.9*   ABG No results for input(s): PHART, PCO2ART, PO2ART in the last 168 hours.  Liver Enzymes  Recent Labs Lab 02/09/2016 1901 03/04/16 0500  AST 266* 182*  ALT 233* 201*  ALKPHOS 80 69  BILITOT 0.9 1.1  ALBUMIN 3.6 3.2*   Cardiac Enzymes  Recent Labs Lab 03/05/2016 2347 03/04/16 0539  TROPONINI 0.32* 0.62*   Glucose  Recent Labs Lab 03/07/2016 2318 03/04/16 0421 03/04/16 0753  GLUCAP 246* 136* 126*   Imaging Ct Head Wo Contrast  Result Date:  03/04/2016 CLINICAL DATA:  80 year old male with altered mental status concern for hemorrhage or stroke. EXAM: CT HEAD WITHOUT CONTRAST TECHNIQUE: Contiguous axial images were obtained from the base of the skull through the vertex without intravenous contrast. COMPARISON:  Head CT dated 01/30/2016 FINDINGS: Brain: There is mild moderate age-related atrophy and chronic microvascular ischemic changes. There is no acute intracranial hemorrhage. No mass effect or midline shift noted. No extra-axial fluid collection. Vascular: There is atherosclerotic calcification of the vertebral arteries. High attenuation of the MCAs as well as basilar artery, likely related to hemoconcentration. CT angiography or MRI may provide better evaluation if there is high clinical concern for acute ischemia. Skull: Normal. Negative for fracture or focal lesion. Sinuses/Orbits: There is diffuse mucoperiosteal thickening of paranasal sinuses. Bilateral maxillary sinus air-fluid levels. A nasogastric and an endotracheal tube are partially visualized. Other: None IMPRESSION: No acute intracranial hemorrhage. Age-related atrophy and chronic microvascular ischemic disease. If symptoms persist and there are no contraindications, MRI may provide better evaluation if clinically indicated. Electronically Signed   By: Elgie CollardArash  Radparvar M.D.   On: 03/04/2016 00:08   Dg Chest Port 1 View  Result Date: 02/17/2016 CLINICAL DATA:  80 year old male with trauma and status post intubation. EXAM: PORTABLE CHEST 1 VIEW COMPARISON:  Chest radiograph dated 05/24/2015 and abdominal radiograph 02/22/2016. FINDINGS: Endotracheal tube the tip approximately 4 cm above the carina. An enteric tube courses into the left upper abdomen and is only partially visualized on this chest radiograph. There are bibasilar atelectatic changes of the lungs. There is no pleural effusion or pneumothorax. There is stable cardiomegaly with mild central vascular prominence, possibly of  congestive changes. Slightly increased left lung interstitial markings may be related to atelectatic changes or underlying contusion is not excluded. No acute osseous pathology identified. IMPRESSION: Endotracheal tube above the carina. Cardiomegaly. Atelectatic changes. Electronically Signed   By: Elgie CollardArash  Radparvar M.D.   On: 03/01/2016 21:33   Dg Chest Portable 1 View  Result Date: 03/06/2016 CLINICAL DATA:  Central line and endotracheal tube placement today. EXAM: PORTABLE CHEST 1 VIEW COMPARISON:  Single-view of the chest 05/24/2015. FINDINGS: Right IJ approach central venous catheter tip projects in the mid to lower superior vena cava. Endotracheal tube tip is in good position at the level of the clavicular heads. NG tube is in the stomach. Pneumothorax. Cardiomegaly without edema is identified. Mild right basilar atelectasis is noted. IMPRESSION: Support tubes and lines projecting good position. Negative for pneumothorax. Streaky right basilar atelectasis. Electronically Signed   By: Drusilla Kannerhomas  Dalessio M.D.   On: 02/16/2016 20:39   Dg Abd Portable 1v  Result Date: 03/05/2016 CLINICAL DATA:  80 year old male with enteric tube placement. EXAM: PORTABLE ABDOMEN - 1 VIEW COMPARISON:  None.  FINDINGS: An enteric tube is partially visualized in the upper abdominal region likely within the stomach. The tube appears to extend into the distal stomach and bulging single U-turn with tip positioned in the proximal stomach. There is moderate stool throughout the colon. There is no bowel dilatation or evidence of obstruction. No free air identified. No radiopaque calculi. Prostate brachytherapy seeds noted. There is degenerative changes of the spine and hips. No acute fracture. IMPRESSION: Enteric tube with tip and side-port in the stomach. Electronically Signed   By: Elgie Collard M.D.   On: Apr 01, 2016 21:30   STUDIES:  CT head pending  CULTURES: Sputum 9/25>>> Urine 9/25>>> Blood  9/25>>>  ANTIBIOTICS: none  SIGNIFICANT EVENTS:   LINES/TUBES: ETT 9/24 >> RIJ CVC 9/24 >> Foley 9/24 >>  DISCUSSION: Mr. Carlini is a 25M with prior history of stroke who presented to the ED after experiencing a cardiac arrest. He was on the porch, felt strange, and told his wife he thought he had had another stroke. EMS was called and after their arrival he started vomiting, had to be suctioned, then became brady and went PEA. The story is suggestive of a primary neurologic event followed by massive aspiration and PEA arrest. After arrival to the ED he was moving spontaneously and required a good bit of sedation. I did not feel that cooling was appropriate in this setting.   ASSESSMENT / PLAN:  PULMONARY A: Acute respiratory failure requiring intubation Probable aspiration event P:   Continue ventilatory support Exchange ETT today, size 7 and unable to pass suction catheter Sputum cultures SBT when able No antibiotics as yet; low threshold to add if he clinically decompensates Hold weaning for now given mental status  CARDIOVASCULAR A:  Cardiac arrest (brady to PEA); EKG unchanged since 01/2016 (t-wave inversion in V1 persists) Shock  P:  Trend troponin No cooling TTE  Maintain MAP > 65 No need for cardiology consult given that this was a respiratory arrest and troponins are only 0.62  NEUROLOGIC A:   Concern for acute stroke  Recent pontine stroke on ASA/Plavix P:   RASS goal: -1 Head CT not significant Pending neurology consult will decide on MRI Neuro consult pending Continue propofol for sedation Hold antiplatelet therapy pending head CT  RENAL A:   Acute kidney injury (Cr 1.34 from 0.93 8/22) P:   Trend Cr Fluid resuscitate I/Os Avoid nephrotoxins Maintain MAP > 65  GASTROINTESTINAL A:   Shock liver P:   NPO Stress ulcer ppx Trend LFTs Nutrition consult for TF  HEMATOLOGIC A:   No acute issues  P:  Exclude hemorrhagic stroke prior to  starting dvt ppx  INFECTIOUS A:   No acute issues Probable aspiration event P:   No antibiotics at this time  ENDOCRINE A:   No acute issues P:   Monitor CBG SSI if needed   FAMILY  - Updates: No family bedside, continue full vent support for now.  - Inter-disciplinary family meet or Palliative Care meeting due by:  day 7  The patient is critically ill with multiple organ systems failure and requires high complexity decision making for assessment and support, frequent evaluation and titration of therapies, application of advanced monitoring technologies and extensive interpretation of multiple databases.   Critical Care Time devoted to patient care services described in this note is  35  Minutes. This time reflects time of care of this signee Dr Koren Bound. This critical care time does not reflect procedure time, or teaching time  or supervisory time of PA/NP/Med student/Med Resident etc but could involve care discussion time.  Alyson Reedy, M.D. [facility] Pulmonary/Critical Care Medicine. Pager: [phone]. After hours pager: [phone].  03/04/2016, 9:35 AM

## 2016-03-04 NOTE — Procedures (Signed)
Intubation Procedure Note Gwenevere GhaziJesse R Gladue [id number] 28-Aug-1935  Procedure: Intubation Indications: Airway protection and maintenance  Procedure Details Consent: Unable to obtain consent because of emergent medical necessity. Time Out: Verified patient identification, verified procedure, site/side was marked, verified correct patient position, special equipment/implants available, medications/allergies/relevent history reviewed, required imaging and test results available.  Performed  Maximum sterile technique was used including gloves, hand hygiene and mask.  Size 7.5 ETT passed over a tube changer.    Evaluation Hemodynamic Status: BP stable throughout; O2 sats: stable throughout Patient's Current Condition: stable Complications: No apparent complications Patient did tolerate procedure well. Chest X-ray ordered to verify placement.  CXR: pending.   Koren BoundYACOUB,WESAM 03/04/2016

## 2016-03-04 NOTE — Progress Notes (Signed)
CCM MD used tube exchanger and switched pt from 7.0 to 7.5 ett with no complications. Pt stable throughout. CXR pending. Positive color change noted on etco2, rhonchi bs throughout. RT will continue to monitor.

## 2016-03-04 NOTE — Progress Notes (Signed)
03/04/16 Pt fentanyl drip stopped and wasted 225 mls in sink with Carlyon ProwsShakiera Whyte RN.

## 2016-03-05 ENCOUNTER — Inpatient Hospital Stay (HOSPITAL_COMMUNITY): Payer: Commercial Managed Care - HMO

## 2016-03-05 ENCOUNTER — Other Ambulatory Visit: Payer: Self-pay

## 2016-03-05 ENCOUNTER — Other Ambulatory Visit (HOSPITAL_COMMUNITY): Payer: Commercial Managed Care - HMO

## 2016-03-05 DIAGNOSIS — I10 Essential (primary) hypertension: Secondary | ICD-10-CM

## 2016-03-05 DIAGNOSIS — R569 Unspecified convulsions: Secondary | ICD-10-CM

## 2016-03-05 DIAGNOSIS — G253 Myoclonus: Secondary | ICD-10-CM

## 2016-03-05 DIAGNOSIS — G931 Anoxic brain damage, not elsewhere classified: Secondary | ICD-10-CM

## 2016-03-05 LAB — BLOOD GAS, ARTERIAL
ACID-BASE EXCESS: 2.7 mmol/L — AB (ref 0.0–2.0)
BICARBONATE: 25.6 mmol/L (ref 20.0–28.0)
Drawn by: 36277
FIO2: 40
LHR: 16 {breaths}/min
MECHVT: 625 mL
O2 Saturation: 97.8 %
PATIENT TEMPERATURE: 100
PCO2 ART: 33.5 mmHg (ref 32.0–48.0)
PEEP/CPAP: 5 cmH2O
PO2 ART: 106 mmHg (ref 83.0–108.0)
pH, Arterial: 7.499 — ABNORMAL HIGH (ref 7.350–7.450)

## 2016-03-05 LAB — CBC
HCT: 34.7 % — ABNORMAL LOW (ref 39.0–52.0)
Hemoglobin: 10.7 g/dL — ABNORMAL LOW (ref 13.0–17.0)
MCH: 25.5 pg — AB (ref 26.0–34.0)
MCHC: 30.8 g/dL (ref 30.0–36.0)
MCV: 82.8 fL (ref 78.0–100.0)
PLATELETS: 111 10*3/uL — AB (ref 150–400)
RBC: 4.19 MIL/uL — AB (ref 4.22–5.81)
RDW: 15.8 % — ABNORMAL HIGH (ref 11.5–15.5)
WBC: 5.4 10*3/uL (ref 4.0–10.5)

## 2016-03-05 LAB — GLUCOSE, CAPILLARY
GLUCOSE-CAPILLARY: 137 mg/dL — AB (ref 65–99)
Glucose-Capillary: 140 mg/dL — ABNORMAL HIGH (ref 65–99)
Glucose-Capillary: 155 mg/dL — ABNORMAL HIGH (ref 65–99)
Glucose-Capillary: 112 mg/dL — ABNORMAL HIGH (ref 65–99)

## 2016-03-05 LAB — BASIC METABOLIC PANEL
Anion gap: 6 (ref 5–15)
BUN: 22 mg/dL — AB (ref 6–20)
CHLORIDE: 110 mmol/L (ref 101–111)
CO2: 26 mmol/L (ref 22–32)
Calcium: 8.8 mg/dL — ABNORMAL LOW (ref 8.9–10.3)
Creatinine, Ser: 1.14 mg/dL (ref 0.61–1.24)
GFR calc Af Amer: >60 mL/min (ref >60)
GFR calc non Af Amer: >60 mL/min (ref >60)
GLUCOSE: 141 mg/dL — AB (ref 65–99)
POTASSIUM: 3.5 mmol/L (ref 3.5–5.1)
Sodium: 142 mmol/L (ref 135–145)

## 2016-03-05 LAB — URINE CULTURE: CULTURE: NO GROWTH

## 2016-03-05 LAB — MAGNESIUM
Magnesium: 2.1 mg/dL (ref 1.7–2.4)
Magnesium: 1.8 mg/dL (ref 1.7–2.4)

## 2016-03-05 LAB — PHOSPHORUS
Phosphorus: 2.1 mg/dL — ABNORMAL LOW (ref 2.5–4.6)
Phosphorus: 4.9 mg/dL — ABNORMAL HIGH (ref 2.5–4.6)

## 2016-03-05 MED ORDER — POTASSIUM PHOSPHATES 15 MMOLE/5ML IV SOLN
30.0000 mmol | Freq: Once | INTRAVENOUS | Status: AC
  Administered 2016-03-05: 30 mmol via INTRAVENOUS
  Filled 2016-03-05: qty 10

## 2016-03-05 MED ORDER — HEPARIN SODIUM (PORCINE) 5000 UNIT/ML IJ SOLN
5000.0000 [IU] | Freq: Three times a day (TID) | INTRAMUSCULAR | Status: DC
  Administered 2016-03-05 – 2016-03-08 (×9): 5000 [IU] via SUBCUTANEOUS
  Filled 2016-03-05 (×9): qty 1

## 2016-03-05 MED ORDER — SODIUM CHLORIDE 0.9 % IV SOLN
3.0000 g | Freq: Three times a day (TID) | INTRAVENOUS | Status: DC
  Administered 2016-03-05 – 2016-03-08 (×10): 3 g via INTRAVENOUS
  Filled 2016-03-05 (×12): qty 3

## 2016-03-05 MED ORDER — SODIUM CHLORIDE 0.9 % IV SOLN
1500.0000 mg | Freq: Once | INTRAVENOUS | Status: AC
  Administered 2016-03-05: 1500 mg via INTRAVENOUS
  Filled 2016-03-05: qty 15

## 2016-03-05 MED ORDER — SODIUM CHLORIDE 0.9 % IV SOLN
1000.0000 mg | Freq: Two times a day (BID) | INTRAVENOUS | Status: DC
  Administered 2016-03-05 – 2016-03-08 (×6): 1000 mg via INTRAVENOUS
  Filled 2016-03-05 (×8): qty 10

## 2016-03-05 NOTE — Patient Outreach (Signed)
[facility] Care Management  03/05/2016  Trevor Bishop Sep 20, 1935 [id number]  REFERRAL DATE: 04/09/16 REFERRAL SOURCE: EMMI stroke red alert REFERRAL REASON: Question/problems regarding medications, Lost interest in things they used to enjoy  CASE CLOSURE: No response from patient after telephone calls and letter outreach.Marland Kitchen.  PLAN; RNCM will refer patient to care management assistant to close due to inability to reach patient.  RNCM will notify patients primary MD of closure.   Trevor InaDavina Aranza Geddes RN,BSN,CCM [facility] Telephonic  [phone]

## 2016-03-05 NOTE — Progress Notes (Signed)
PULMONARY / CRITICAL CARE MEDICINE   Name: Trevor Bishop MRN: [id number] DOB: [birth year]    ADMISSION DATE:  03/05/2016 CONSULTATION DATE:    REFERRING MD:  EDP  CHIEF COMPLAINT:  PEA arrest, ? stroke  HISTORY OF PRESENT ILLNESS:   Mr. Trevor Bishop is a 67M who presented to the ED via EMS. His wife reports that he was sitting on the porch and felt funny and told her he though he had had another stroke. She went to call EMS and returned to find him drooling and slumped over. EMS arrived and shortly thereafter he began vomiting. EMS had to suction his mouth / throat. Once they got him in the ambulance, he became bradycardic and had a PEA arrest. He required multiple rounds of epinephrine and apparently had to be resuscitated twice.  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 No other recent symptoms - no fever / chills / nausea / vomiting / diarrhea / cough / shortness of breath / sputum / chest pain. She says he did not complain of chest pain earlier today.   In the ED, ETT placement was confirmed, a RIJ catheter was placed, he was moving around a lot and was started on fentanyl and propofol for sedation. Epinephrine was required to maintain his blood pressure.   SUBJECTIVE:  No events overnight.  VITAL SIGNS: BP (!) 157/69   Pulse 75   Temp 98.1 F (36.7 C)   Resp 16   Ht 6' (1.829 m)   Wt 87.7 kg (193 lb 5.5 oz)   SpO2 100%   BMI 26.22 kg/m   HEMODYNAMICS: CVP:  [2 mmHg-5 mmHg] 4 mmHg  VENTILATOR SETTINGS: Vent Mode: PRVC FiO2 (%):  [30 %-40 %] 30 % Set Rate:  [16 bmp] 16 bmp Vt Set:  [520 mL-620 mL] 620 mL PEEP:  [5 cmH20] 5 cmH20 Plateau Pressure:  [17 cmH20-19 cmH20] 18 cmH20  INTAKE / OUTPUT: I/O last 3 completed shifts: In: 3558.1 [I.V.:2157.1; NG/GT:951; IV Piggyback:450] Out: 1180 [Urine:980; Emesis/NG output:200]  PHYSICAL EXAMINATION:  General Well nourished, well developed, intubated, off sedation,  withdraws to pain but not command.  HEENT No gross abnormalities.   Pulmonary Clear to auscultation bilaterally with no wheezes, rales or ronchi. Good effort, symmetrical expansion.   Cardiovascular Normal rate, regular rhythm. S1, s2. No m/r/g. Distal pulses palpable.  Abdomen Soft, non-tender, non-distended, positive bowel sounds, no palpable organomegaly or masses. Normoresonant to percussion.  Musculoskeletal Normal bulk and tone  Lymphatics No cervical, supraclavicular or axillary adenopathy.   Neurologic PERRL, corneals intact, + cough, +gag, spontaneous movement LLE, withdraws to pain  Skin/Integuement No rash, no cyanosis, no clubbing.    LABS:  BMET  Recent Labs Lab 03/07/2016 2347 03/04/16 0500 03/05/16 0450  NA 138 138 142  K 2.5* 4.0 3.5  CL 102 107 110  CO2 20* 24 26  BUN 13 13 22*  CREATININE 1.35* 1.09 1.14  GLUCOSE 308* 128* 141*   Electrolytes  Recent Labs Lab 02/16/2016 1901 02/14/2016 2347 03/04/16 0500 03/04/16 0539 03/05/16 0450  CALCIUM 8.4* 8.1* 8.4*  --  8.8*  MG 2.4  --   --  2.2 2.1  PHOS  --   --   --  1.5* 2.1*   CBC  Recent Labs Lab 02/23/2016 1901 03/04/16 0539 03/05/16 0450  WBC 6.6 7.1 5.4  HGB 13.5 12.2* 10.7*  HCT 45.0 39.7 34.7*  PLT 174 140* 111*  Coag's No results for input(s): APTT, INR in the last 168 hours.  Sepsis Markers  Recent Labs Lab 03/18/16 1853 2016/03/18 2300  LATICACIDVEN 7.27* 6.9*   ABG  Recent Labs Lab 03/05/16 0337  PHART 7.499*  PCO2ART 33.5  PO2ART 106   Liver Enzymes  Recent Labs Lab March 18, 2016 1901 03/04/16 0500  AST 266* 182*  ALT 233* 201*  ALKPHOS 80 69  BILITOT 0.9 1.1  ALBUMIN 3.6 3.2*   Cardiac Enzymes  Recent Labs Lab 2016/03/18 2347 03/04/16 0539  TROPONINI 0.32* 0.62*   Glucose  Recent Labs Lab 03/04/16 1150 03/04/16 1621 03/04/16 2028 03/04/16 2320 03/05/16 0417 03/05/16 0753  GLUCAP 124* 114* 154* 140* 140* 137*   Imaging Dg Chest Port 1 View  Result  Date: 03/04/2016 CLINICAL DATA:  Intubated EXAM: PORTABLE CHEST 1 VIEW COMPARISON:  2016/03/18 FINDINGS: Cardiomediastinal silhouette is stable. NG tube is unchanged in position. There is endotracheal tube with tip 1 cm above the carina. Right IJ central line with tip in SVC. Right infrahilar streaky atelectasis or infiltrate. No pulmonary edema. There is no pneumothorax. No pulmonary edema. IMPRESSION: Support apparatus in place. No pneumothorax. Right infrahilar streaky atelectasis or early infiltrate. No pulmonary edema. Electronically Signed   By: Natasha Mead M.D.   On: 03/04/2016 10:20   STUDIES:  CT head pending  CULTURES: Sputum 9/25>>>GPC Urine 9/25>>>NTD Blood 9/25>>>NTD  ANTIBIOTICS: Unasyn 9/26>>>  SIGNIFICANT EVENTS:   LINES/TUBES: ETT 9/24 >> RIJ CVC 9/24 >> Foley 9/24 >>  DISCUSSION: Trevor Bishop is a 43M with prior history of stroke who presented to the ED after experiencing a cardiac arrest. He was on the porch, felt strange, and told his wife he thought he had had another stroke. EMS was called and after their arrival he started vomiting, had to be suctioned, then became brady and went PEA. The story is suggestive of a primary neurologic event followed by massive aspiration and PEA arrest. After arrival to the ED he was moving spontaneously and required a good bit of sedation. I did not feel that cooling was appropriate in this setting.   ASSESSMENT / PLAN:  PULMONARY A: Acute respiratory failure requiring intubation Probable aspiration event P:   Continue ventilatory support Sputum cultures Unasyn for ?aspiration PNA Hold weaning for now given mental status  CARDIOVASCULAR A:  Cardiac arrest (brady to PEA); EKG unchanged since 01/2016 (t-wave inversion in V1 persists) Shock  P:  No cooling TTE not done, will repeat Maintain MAP > 65 No need for cardiology consult given that this was a respiratory arrest and troponins are only 0.62  NEUROLOGIC A:   Concern  for acute stroke  Recent pontine stroke on ASA/Plavix P:   RASS goal: -1. Head CT not significant. Neuro consult appreciated. D/C propofol and fentanyl.  RENAL A:   Acute kidney injury (Cr 1.34 from 0.93 8/22) P:   BMET in AM. Replace electrolytes as indicated.  KVO IVF. Strict I/Os. Maintain MAP > 65.  GASTROINTESTINAL A:   Shock liver P:   Stress ulcer ppx Trend LFTs Nutrition consult for TF  HEMATOLOGIC A:   No acute issues  P:  SCD's SQ heparine  INFECTIOUS A:   No acute issues Probable aspiration event P:   Unasyn as above F/u on cultures.  ENDOCRINE A:   No acute issues P:   Monitor CBG SSI if needed  FAMILY  - Updates: Family updated bedside.  - Inter-disciplinary family meet or Palliative Care meeting due by:  day  7  The patient is critically ill with multiple organ systems failure and requires high complexity decision making for assessment and support, frequent evaluation and titration of therapies, application of advanced monitoring technologies and extensive interpretation of multiple databases.   Critical Care Time devoted to patient care services described in this note is  35  Minutes. This time reflects time of care of this signee Dr Koren Bound. This critical care time does not reflect procedure time, or teaching time or supervisory time of PA/NP/Med student/Med Resident etc but could involve care discussion time.  Alyson Reedy, M.D. [facility] Pulmonary/Critical Care Medicine. Pager: [phone]. After hours pager: [phone].  03/05/2016, 8:11 AM

## 2016-03-05 NOTE — Procedures (Signed)
Electroencephalogram (EEG) Report  Date of study: 03/05/16   Requesting clinician: Melba Coon, M.D.  Reason for study: Evaluate for seizure activity  Brief clinical history: 80 year old man with cardiac arrest resulting in anoxic brain injury. Off sedation, he has been noted to have a left gaze deviation with nystagmus. EEG is being performed to exclude seizure.  Medications:  Current Facility-Administered Medications:  .  0.9 %  sodium chloride infusion, 250 mL, Intravenous, PRN, Dannielle Burn, MD, Last Rate: 10 mL/hr at 03/05/16 0600, 250 mL at 03/05/16 0600 .  Ampicillin-Sulbactam (UNASYN) 3 g in sodium chloride 0.9 % 100 mL IVPB, 3 g, Intravenous, Q8H, Lyndee Leo, RPH, 3 g at 03/05/16 0925 .  chlorhexidine gluconate (MEDLINE KIT) (PERIDEX) 0.12 % solution 15 mL, 15 mL, Mouth Rinse, BID, Rush Farmer, MD, 15 mL at 03/05/16 0805 .  famotidine (PEPCID) 40 MG/5ML suspension 20 mg, 20 mg, Per Tube, BID, Dannielle Burn, MD, 20 mg at 03/05/16 0837 .  feeding supplement (VITAL HIGH PROTEIN) liquid 1,000 mL, 1,000 mL, Per Tube, Continuous, Rush Farmer, MD, Last Rate: 60 mL/hr at 03/05/16 0600, 1,000 mL at 03/05/16 0600 .  heparin injection 5,000 Units, 5,000 Units, Subcutaneous, Q8H, Rush Farmer, MD, 5,000 Units at 03/05/16 0905 .  insulin aspart (novoLOG) injection 2-6 Units, 2-6 Units, Subcutaneous, Q4H, Collene Gobble, MD, 4 Units at 03/05/16 1152 .  levETIRAcetam (KEPPRA) 1,000 mg in sodium chloride 0.9 % 100 mL IVPB, 1,000 mg, Intravenous, Q12H, Darrel Reach, MD .  levETIRAcetam (KEPPRA) 1,500 mg in sodium chloride 0.9 % 100 mL IVPB, 1,500 mg, Intravenous, Once, Darrel Reach, MD .  MEDLINE mouth rinse, 15 mL, Mouth Rinse, 10 times per day, Rush Farmer, MD, 15 mL at 03/05/16 1538 .  norepinephrine (LEVOPHED) 4 mg in dextrose 5 % 250 mL (0.016 mg/mL) infusion, 2-50 mcg/min, Intravenous, Continuous, Dannielle Burn, MD, Stopped at 03/04/16 [phone]   propofol (DIPRIVAN) 1000 MG/100ML infusion, 5-70 mcg/kg/min, Intravenous, Titrated, Anders Simmonds, MD, Stopped at 03/04/16 1130  Description: This is a routine EEG performed using standard international 10-20 electrode placement. A total of 18 channels are recorded, including one for the EKG.  Activating Maneuvers: None  Findings:  The patient is intubated in the intensive care unit. He is unresponsive. No sedation is running at the time of this recording and has been off for several hours.  The EKG channel demonstrates are regular rhythm with a rate of about 90 beats per minute.   The background consists of mildly disorganized theta activity at approximately 5 Hz. There is some intermixed delta activity as well. This background is poorly reactive.  Throughout the recording, there are several episodes of rhythmic delta activity. This appears to start with buildup in the right central parietal region before spreading through the right hemisphere. Most of these seem to be confined to the right hemisphere though at times it does appear to become somewhat more generalized. These are consistent with electrographic seizures. He does not appear to have any clinical correlate with these episodes apart from left eye deviation and nystagmus.  Myoclonic activity of the face and mouth was observed and did not appear to have any electrographic correlate.  Interictally, he has occasional generalized epileptiform discharges.   Impression: This is an abnormal EEG due to the presence of several electrographic seizures. Many of these appear to be partial seizures involving the right hemisphere though at times there seems to be secondary generalization  as well. Interictal background demonstrates moderate diffuse slowing.   Melba Coon, MD [facility]

## 2016-03-05 NOTE — Progress Notes (Addendum)
Neurology Progress Note  Subjective: He is now off sedation but remains unresponsive with no evidence of higher cortical function so far. He has had some occasional myoclonic jerking of the head. In addition, his RN has noted occasional right gaze with nystagmus this morning as well. He is unresponsive and unable to participate with ROS.  Current Meds:   Current Facility-Administered Medications:  .  0.9 %  sodium chloride infusion, 250 mL, Intravenous, PRN, Dannielle Burn, MD, Last Rate: 10 mL/hr at 03/05/16 0600, 250 mL at 03/05/16 0600 .  Ampicillin-Sulbactam (UNASYN) 3 g in sodium chloride 0.9 % 100 mL IVPB, 3 g, Intravenous, Q8H, Lyndee Leo, RPH, 3 g at 03/05/16 0925 .  chlorhexidine gluconate (MEDLINE KIT) (PERIDEX) 0.12 % solution 15 mL, 15 mL, Mouth Rinse, BID, Javier Glazier, MD, 15 mL at 03/05/16 0805 .  famotidine (PEPCID) 40 MG/5ML suspension 20 mg, 20 mg, Per Tube, BID, Dannielle Burn, MD, 20 mg at 03/05/16 0837 .  feeding supplement (VITAL HIGH PROTEIN) liquid 1,000 mL, 1,000 mL, Per Tube, Continuous, Rush Farmer, MD, Last Rate: 60 mL/hr at 03/05/16 0600, 1,000 mL at 03/05/16 0600 .  heparin injection 5,000 Units, 5,000 Units, Subcutaneous, Q8H, Rush Farmer, MD, 5,000 Units at 03/05/16 0905 .  insulin aspart (novoLOG) injection 2-6 Units, 2-6 Units, Subcutaneous, Q4H, Collene Gobble, MD, 4 Units at 03/05/16 1152 .  MEDLINE mouth rinse, 15 mL, Mouth Rinse, 10 times per day, Javier Glazier, MD, 15 mL at 03/05/16 1400 .  norepinephrine (LEVOPHED) 4 mg in dextrose 5 % 250 mL (0.016 mg/mL) infusion, 2-50 mcg/min, Intravenous, Continuous, Dannielle Burn, MD, Stopped at 03/04/16 [phone] .  potassium phosphate 30 mmol in dextrose 5 % 500 mL infusion, 30 mmol, Intravenous, Once, Rush Farmer, MD, 30 mmol at 03/05/16 0905 .  propofol (DIPRIVAN) 1000 MG/100ML infusion, 5-70 mcg/kg/min, Intravenous, Titrated, Anders Simmonds, MD, Stopped at 03/04/16  1130  Objective:  Temp:  [95.4 F (35.2 C)-100.8 F (38.2 C)] 99.1 F (37.3 C) (09/26 1400) Pulse Rate:  [37-85] 85 (09/26 1400) Resp:  [16-20] 16 (09/26 1400) BP: (91-167)/(37-75) 151/69 (09/26 1400) SpO2:  [98 %-100 %] 100 % (09/26 1400) FiO2 (%):  [30 %-40 %] 30 % (09/26 1400) Weight:  [87.7 kg (193 lb 5.5 oz)] 87.7 kg (193 lb 5.5 oz) (09/26 0500)  General:Intubated, currently no sedation. He shows no response to verbal, tactile, or noxious stimuli. There is no eye opening, either spontaneously or with stimulation. He does not follow any commands.  HEENT: Neck is supple without lymphadenopathy. ETT, OGT in place. Sclerae are anicteric and mildly edematous. There is mild conjunctival injection.  CV: Regular, distant, no obvious murmur.  Lungs: CTAB  Neuro: MS: As noted above. CN: Pupils are equal and reactive from 3-->2 mm bilaterally. He has a right gaze with nystagmoid eye movements. He does not Dolls past midline. Corneals are present, much stronger on the left side. His face appears grossly symmetric but is partly obscured by tubes and tape. He has a weak gag. The remainder of his cranial nerves cannot be accurately assessed as he does not participate with the exam.   Motor: Normal bulk. Increased tone on the left. Occasional myoclonic activity of the head is noted, mainly with stimulation.  Sensation: He has brisk flexion to nailbed pressure in BLE. There is shoulder adduction with nailbed pressure in BUE.  DTRs: 3+, brisker on the L. Toes are upgoing bilaterally.  Coordination and  gait: These cannot be assessed as he is not able to participate.   Labs: Lab Results  Component Value Date   WBC 5.4 03/05/2016   HGB 10.7 (L) 03/05/2016   HCT 34.7 (L) 03/05/2016   PLT 111 (L) 03/05/2016   GLUCOSE 141 (H) 03/05/2016   CHOL 102 01/30/2016   TRIG 66 01/30/2016   HDL 32 (L) 01/30/2016   LDLCALC 57 01/30/2016   ALT 201 (H) 03/04/2016   AST 182 (H) 03/04/2016   NA 142 03/05/2016    K 3.5 03/05/2016   CL 110 03/05/2016   CREATININE 1.14 03/05/2016   BUN 22 (H) 03/05/2016   CO2 26 03/05/2016   INR 1.13 01/29/2016   HGBA1C 5.7 (H) 01/30/2016   CBC Latest Ref Rng & Units 03/05/2016 03/04/2016 03/01/2016  WBC 4.0 - 10.5 K/uL 5.4 7.1 6.6  Hemoglobin 13.0 - 17.0 g/dL 10.7(L) 12.2(L) 13.5  Hematocrit 39.0 - 52.0 % 34.7(L) 39.7 45.0  Platelets 150 - 400 K/uL 111(L) 140(L) 174    Lab Results  Component Value Date   HGBA1C 5.7 (H) 01/30/2016   Lab Results  Component Value Date   ALT 201 (H) 03/04/2016   AST 182 (H) 03/04/2016   ALKPHOS 69 03/04/2016   BILITOT 1.1 03/04/2016    A/P:   1. Anoxic brain injury: This is acute, due to cardiac arrest. Initial rhythm was noted to be PEA. He had immediate resuscitative efforts by EMS as the arrest occurred in their presence. Treatment at this point is supportive as noted below. He is presently nearing 48 hours out from his resuscitation. He did not receive therapeutic hypothermia.  2. 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 The current examination shows reactive pupils, intact corneals (the right is weaker than left), and flexion withdrawal to pain. He has some brief episodic myoclonus today. Continue supportive care. Avoid hypoxemia and hypotension for even brief intervals as these are associated with worse neurologic outcomes. Fever and hyperglycemia must be aggressively treated for the same reason. We will continue to follow the exam to aid with neurologic prognosis. No neurologic improvement thus far.   3. Myoclonus: This is consistent with a severe anoxic brain injury. Due to his right gaze and nystagmoid eye movements, EEG was obtained. This shows intermittent seizure activity. I will start [place] and repeat EEG in AM.   At family and RN request, attempted to call patient's daughter, Lannette Donath, at the number provided [phone]) to update. No answer, no voicemail after 15  rings. Will have to try again later as time allows.   This patient is critically ill and at significant risk of neurological worsening, death and care requires constant monitoring of vital signs, hemodynamics,respiratory and cardiac monitoring, neurological assessment, discussion with family, other specialists and medical decision making of high complexity. A total of 40 minutes of critical care time was spent on this case.   Melba Coon, MD [facility]   1710--Tried again to contact the patient's daughter, Lannette Donath. Still no answer so unable to connect. Will try again tomorrow.

## 2016-03-05 NOTE — Progress Notes (Signed)
Pharmacy Antibiotic Note  Trevor Bishop is a 80 y.o. male admitted on January 20, 2016 with possible aspiration pneumonia.  Pharmacy has been consulted for unasyn dosing.  S/p PEA arrest, found drooling and slumped over. With concern for aspiration will start empiric antibiotics. Tmax 100.8, wbc normal. Lactic acid 6.9. CXR bibasilar atelectasis or infiltrate.   Plan: Unasyn 3g IV q8 hours Follow up culture data  Height: 6' (182.9 cm) Weight: 193 lb 5.5 oz (87.7 kg) IBW/kg (Calculated) : 77.6  Temp (24hrs), Avg:98.6 F (37 C), Min:95.4 F (35.2 C), Max:100.8 F (38.2 C)   Recent Labs Lab 08/02/15 1853 08/02/15 1901 08/02/15 2300 08/02/15 2347 03/04/16 0500 03/04/16 0539 03/05/16 0450  WBC  --  6.6  --   --   --  7.1 5.4  CREATININE 1.10 1.34*  --  1.35* 1.09  --  1.14  LATICACIDVEN 7.27*  --  6.9*  --   --   --   --     Estimated Creatinine Clearance: 59.6 mL/min (by C-G formula based on SCr of 1.14 mg/dL).    No Known Allergies  Antimicrobials this admission: Unasyn 9/26 >>    Dose adjustments this admission: n/a  Microbiology results: 9/25 BCx: sent 9/25 UCx: ng 9/24 MRSA PCR: neg  Thank you for allowing pharmacy to be a part of this patient's care.  Sheppard CoilFrank Wilson PharmD., BCPS Clinical Pharmacist Pager [phone] 03/05/2016 8:57 AM

## 2016-03-05 NOTE — Progress Notes (Signed)
EEG completed, results pending. 

## 2016-03-06 ENCOUNTER — Inpatient Hospital Stay (HOSPITAL_COMMUNITY): Payer: Commercial Managed Care - HMO

## 2016-03-06 DIAGNOSIS — R569 Unspecified convulsions: Secondary | ICD-10-CM

## 2016-03-06 LAB — BASIC METABOLIC PANEL
Anion gap: 12 (ref 5–15)
BUN: 18 mg/dL (ref 6–20)
CALCIUM: 8.7 mg/dL — AB (ref 8.9–10.3)
CHLORIDE: 105 mmol/L (ref 101–111)
CO2: 22 mmol/L (ref 22–32)
CREATININE: 0.96 mg/dL (ref 0.61–1.24)
GFR calc Af Amer: >60 mL/min (ref >60)
GFR calc non Af Amer: >60 mL/min (ref >60)
Glucose, Bld: 135 mg/dL — ABNORMAL HIGH (ref 65–99)
Potassium: 3.2 mmol/L — ABNORMAL LOW (ref 3.5–5.1)
Sodium: 139 mmol/L (ref 135–145)

## 2016-03-06 LAB — CBC
HEMATOCRIT: 32.3 % — AB (ref 39.0–52.0)
HEMOGLOBIN: 10.1 g/dL — AB (ref 13.0–17.0)
MCH: 25.9 pg — AB (ref 26.0–34.0)
MCHC: 31.3 g/dL (ref 30.0–36.0)
MCV: 82.8 fL (ref 78.0–100.0)
Platelets: 108 10*3/uL — ABNORMAL LOW (ref 150–400)
RBC: 3.9 MIL/uL — ABNORMAL LOW (ref 4.22–5.81)
RDW: 15.9 % — ABNORMAL HIGH (ref 11.5–15.5)
WBC: 4.6 10*3/uL (ref 4.0–10.5)

## 2016-03-06 LAB — BLOOD GAS, ARTERIAL
Acid-Base Excess: 1.2 mmol/L (ref 0.0–2.0)
Acid-Base Excess: 2.3 mmol/L — ABNORMAL HIGH (ref 0.0–2.0)
BICARBONATE: 23.7 mmol/L (ref 20.0–28.0)
BICARBONATE: 25.9 mmol/L (ref 20.0–28.0)
Drawn by: 418751
Drawn by: 44371
FIO2: 30
FIO2: 30
MECHVT: 620 mL
O2 Saturation: 99.7 %
O2 Saturation: 97.2 %
PATIENT TEMPERATURE: 98.6
PATIENT TEMPERATURE: 99.3
PCO2 ART: 27.6 mmHg — AB (ref 32.0–48.0)
PCO2 ART: 37.6 mmHg (ref 32.0–48.0)
PEEP: 5 cmH2O
PEEP: 5 cmH2O
PO2 ART: 369 mmHg — AB (ref 83.0–108.0)
RATE: 16 resp/min
RATE: 11 resp/min
VT: 620 mL
pH, Arterial: 7.542 — ABNORMAL HIGH (ref 7.350–7.450)
pH, Arterial: 7.454 — ABNORMAL HIGH (ref 7.350–7.450)
pO2, Arterial: 94.2 mmHg (ref 83.0–108.0)

## 2016-03-06 LAB — HEPATIC FUNCTION PANEL
ALBUMIN: 2.7 g/dL — AB (ref 3.5–5.0)
ALT: 92 U/L — AB (ref 17–63)
AST: 45 U/L — AB (ref 15–41)
Alkaline Phosphatase: 60 U/L (ref 38–126)
BILIRUBIN DIRECT: 0.2 mg/dL (ref 0.1–0.5)
Indirect Bilirubin: 0.9 mg/dL (ref 0.3–0.9)
TOTAL PROTEIN: 5.5 g/dL — AB (ref 6.5–8.1)
Total Bilirubin: 1.1 mg/dL (ref 0.3–1.2)

## 2016-03-06 LAB — GLUCOSE, CAPILLARY
GLUCOSE-CAPILLARY: 109 mg/dL — AB (ref 65–99)
GLUCOSE-CAPILLARY: 123 mg/dL — AB (ref 65–99)
GLUCOSE-CAPILLARY: 126 mg/dL — AB (ref 65–99)
GLUCOSE-CAPILLARY: 126 mg/dL — AB (ref 65–99)
GLUCOSE-CAPILLARY: 137 mg/dL — AB (ref 65–99)
Glucose-Capillary: 129 mg/dL — ABNORMAL HIGH (ref 65–99)
Glucose-Capillary: 122 mg/dL — ABNORMAL HIGH (ref 65–99)
Glucose-Capillary: 120 mg/dL — ABNORMAL HIGH (ref 65–99)

## 2016-03-06 LAB — PHENYTOIN LEVEL, TOTAL: Phenytoin Lvl: 9.8 ug/mL — ABNORMAL LOW (ref 10.0–20.0)

## 2016-03-06 LAB — PHOSPHORUS
PHOSPHORUS: 4.3 mg/dL (ref 2.5–4.6)
Phosphorus: 2.8 mg/dL (ref 2.5–4.6)

## 2016-03-06 LAB — CULTURE, RESPIRATORY W GRAM STAIN: Culture: NORMAL

## 2016-03-06 LAB — MAGNESIUM
Magnesium: 1.8 mg/dL (ref 1.7–2.4)
Magnesium: 2 mg/dL (ref 1.7–2.4)

## 2016-03-06 LAB — CULTURE, RESPIRATORY

## 2016-03-06 MED ORDER — SODIUM CHLORIDE 0.9 % IV SOLN
10.0000 mg/kg | Freq: Once | INTRAVENOUS | Status: AC
  Administered 2016-03-06: 874 mg via INTRAVENOUS
  Filled 2016-03-06: qty 17.48

## 2016-03-06 MED ORDER — VITAL AF 1.2 CAL PO LIQD
1000.0000 mL | ORAL | Status: DC
  Administered 2016-03-06 – 2016-03-07 (×3): 1000 mL
  Filled 2016-03-06: qty 1000

## 2016-03-06 MED ORDER — PHENYTOIN SODIUM 50 MG/ML IJ SOLN
100.0000 mg | Freq: Three times a day (TID) | INTRAMUSCULAR | Status: DC
  Administered 2016-03-06 – 2016-03-08 (×6): 100 mg via INTRAVENOUS
  Filled 2016-03-06 (×7): qty 2

## 2016-03-06 MED ORDER — POTASSIUM CHLORIDE 10 MEQ/50ML IV SOLN
10.0000 meq | INTRAVENOUS | Status: AC
  Administered 2016-03-06 (×4): 10 meq via INTRAVENOUS
  Filled 2016-03-06 (×5): qty 50

## 2016-03-06 NOTE — Progress Notes (Signed)
eLink Physician-Brief Progress Note Patient Name: Trevor GhaziJesse R Bishop DOB: 17-Oct-1935 MRN: [id number]   Date of Service  03/06/2016  HPI/Events of Note  Multiple issues:  K+ = 3.2 and Creatinine = 0.96 and 2. ABG on 30%/PRVC 16/TV 620/P 5 = 7.54/27.6/369/23.7.  eICU Interventions  Will order: 1. Replace K+. 2. Decrease PRVC rate to 11.  3. ABG at 7 AM.      Intervention Category Major Interventions: Acid-Base disturbance - evaluation and management;Respiratory failure - evaluation and management;Electrolyte abnormality - evaluation and management  Trevor Bishop 03/06/2016, 5:02 AM

## 2016-03-06 NOTE — Progress Notes (Signed)
Neurology Progress Note  Subjective: He has been off sedation since yesterday, still intubated. He has not demonstrated any significant changes in his neuro exam. No overt seizure activity reported. EEG yesterday showed recurrent seizures primarily involving the R cerebral hemisphere so he was started on Keppra. He is unresponsive and unable to participate with ROS.  Current Meds:   Current Facility-Administered Medications:  .  0.9 %  sodium chloride infusion, 250 mL, Intravenous, PRN, Dannielle Burn, MD, Last Rate: 10 mL/hr at 03/05/16 2000, 250 mL at 03/05/16 2000 .  Ampicillin-Sulbactam (UNASYN) 3 g in sodium chloride 0.9 % 100 mL IVPB, 3 g, Intravenous, Q8H, Lyndee Leo, RPH, 3 g at 03/06/16 0300 .  chlorhexidine gluconate (MEDLINE KIT) (PERIDEX) 0.12 % solution 15 mL, 15 mL, Mouth Rinse, BID, Rush Farmer, MD, 15 mL at 03/06/16 0741 .  famotidine (PEPCID) 40 MG/5ML suspension 20 mg, 20 mg, Per Tube, BID, Dannielle Burn, MD, 20 mg at 03/05/16 2100 .  feeding supplement (VITAL HIGH PROTEIN) liquid 1,000 mL, 1,000 mL, Per Tube, Continuous, Rush Farmer, MD, Last Rate: 60 mL/hr at 03/05/16 2300, 1,000 mL at 03/05/16 2300 .  heparin injection 5,000 Units, 5,000 Units, Subcutaneous, Q8H, Rush Farmer, MD, 5,000 Units at 03/06/16 0538 .  insulin aspart (novoLOG) injection 2-6 Units, 2-6 Units, Subcutaneous, Q4H, Collene Gobble, MD, 2 Units at 03/06/16 0740 .  levETIRAcetam (KEPPRA) 1,000 mg in sodium chloride 0.9 % 100 mL IVPB, 1,000 mg, Intravenous, Q12H, Darrel Reach, MD, 1,000 mg at 03/06/16 0739 .  MEDLINE mouth rinse, 15 mL, Mouth Rinse, 10 times per day, Rush Farmer, MD, 15 mL at 03/06/16 0400 .  norepinephrine (LEVOPHED) 4 mg in dextrose 5 % 250 mL (0.016 mg/mL) infusion, 2-50 mcg/min, Intravenous, Continuous, Dannielle Burn, MD, Stopped at 03/04/16 [phone] .  potassium chloride 10 mEq in 50 mL *CENTRAL LINE* IVPB, 10 mEq, Intravenous, Q1 Hr x 4, Anders Simmonds, MD, 10 mEq at 03/06/16 0740 .  propofol (DIPRIVAN) 1000 MG/100ML infusion, 5-70 mcg/kg/min, Intravenous, Titrated, Anders Simmonds, MD, Stopped at 03/04/16 1130  Objective:  Temp:  [98.2 F (36.8 C)-100.2 F (37.9 C)] 98.2 F (36.8 C) (09/27 0800) Pulse Rate:  [69-85] 74 (09/27 0800) Resp:  [11-18] 11 (09/27 0800) BP: (103-167)/(51-78) 151/78 (09/27 0800) SpO2:  [98 %-100 %] 100 % (09/27 0800) FiO2 (%):  [30 %] 30 % (09/27 0800) Weight:  [87.4 kg (192 lb 10.9 oz)] 87.4 kg (192 lb 10.9 oz) (09/27 0400)  General:Intubated, currently no sedation. He opens his eyes to voice and appears to track me around his bed. He also appears to fix on me at least occasionally. He does not follow any commands.  HEENT: Neck is supple without lymphadenopathy. ETT, OGT in place. Sclerae are anicteric and mildly edematous. There is mild conjunctival injection.  CV: Regular, distant, no obvious murmur.  Lungs: CTAB  Neuro: MS: As noted above. CN: Pupils are equal and reactive from 3-->2 mm bilaterally. He no longer has a fixed right gaze. He is able to track me around his bed with grossly normal eye movements. Corneals are present, much stronger on the left side. His face appears grossly symmetric but is partly obscured by tubes and tape. The remainder of his cranial nerves cannot be accurately assessed as he does not participate with the exam.   Motor: Normal bulk. Increased tone on the left. No myoclonus or other abnormal movements are seen.  Sensation: He  has brisk flexion to nailbed pressure in BLE that seems semi-purposeful. There is some flexion withdrawal with nailbed pressure in BUE.  DTRs: 3+, brisker on the L. Toes are upgoing bilaterally.  Coordination and gait: These cannot be assessed as he is not able to participate.   Labs: Lab Results  Component Value Date   WBC 4.6 03/06/2016   HGB 10.1 (L) 03/06/2016   HCT 32.3 (L) 03/06/2016   PLT 108 (L) 03/06/2016   GLUCOSE 135 (H) 03/06/2016    CHOL 102 01/30/2016   TRIG 66 01/30/2016   HDL 32 (L) 01/30/2016   LDLCALC 57 01/30/2016   ALT 92 (H) 03/06/2016   AST 45 (H) 03/06/2016   NA 139 03/06/2016   K 3.2 (L) 03/06/2016   CL 105 03/06/2016   CREATININE 0.96 03/06/2016   BUN 18 03/06/2016   CO2 22 03/06/2016   INR 1.13 01/29/2016   HGBA1C 5.7 (H) 01/30/2016   CBC Latest Ref Rng & Units 03/06/2016 03/05/2016 03/04/2016  WBC 4.0 - 10.5 K/uL 4.6 5.4 7.1  Hemoglobin 13.0 - 17.0 g/dL 10.1(L) 10.7(L) 12.2(L)  Hematocrit 39.0 - 52.0 % 32.3(L) 34.7(L) 39.7  Platelets 150 - 400 K/uL 108(L) 111(L) 140(L)    Lab Results  Component Value Date   HGBA1C 5.7 (H) 01/30/2016   Lab Results  Component Value Date   ALT 92 (H) 03/06/2016   AST 45 (H) 03/06/2016   ALKPHOS 60 03/06/2016   BILITOT 1.1 03/06/2016   EEG this morning was briefly reviewed at the bedside. This shows mild background slowing. There is persistent and nearly continuous epileptiform activity at the vertex concerning for focal seizure. Full report will follow.   A/P:   1. Anoxic brain injury: This is acute, due to cardiac arrest. Initial rhythm was noted to be PEA. He had immediate resuscitative efforts by EMS as the arrest occurred in their presence. Treatment at this point is supportive as noted below. He is presently nearing 72 hours out from his resuscitation. He did not receive therapeutic hypothermia.  2. 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 This has been compounded by the presence of seizure activity which has almost certainly contributed to his encephalopathy, particularly given the improved tracking noted on today's exam. Seizures will be aggressively treated as noted below to ensure these are not limiting his recovery. Continue supportive care. Avoid hypoxemia and hypotension for even brief intervals as these are associated with worse neurologic outcomes. Fever and hyperglycemia must be aggressively treated for  the same reason. We will continue to follow the exam to aid with neurologic prognosis but remain cautiously optimistic for meaningful recovery if seizures can be controlled.   3. Seizures: This is consistent with a severe anoxic brain injury. Yesterday's EEG showed intermittent seizures in the R hemisphere and he was started on Keppra. Repeat EEG this morning showed resolution of R hemispheric seizure but nearly continuous epileptiform activity at the vertex that is concerning for ongoing focal seizure. Continue Keppra. I will load with 10 mg PE/kg fosphenytoin and initiate continuous EEG.   This was discussed with his daughter, Lannette Donath, who is present at the bedside. She is in agreement with the plan as noted. She was given the opportunity to ask questions and these were addressed to her satisfaction.   I also briefly discussed the above findings and plan with Dr. Nelda Marseille.  This patient is critically ill and at significant risk of neurological worsening, death and care requires  constant monitoring of vital signs, hemodynamics,respiratory and cardiac monitoring, neurological assessment, discussion with family, other specialists and medical decision making of high complexity. A total of 45 minutes of critical care time was spent on this case.   Melba Coon, MD [facility]   1710--Tried again to contact the patient's daughter, Lannette Donath. Still no answer so unable to connect. Will try again tomorrow.

## 2016-03-06 NOTE — Progress Notes (Signed)
PULMONARY / CRITICAL CARE MEDICINE   Name: Trevor Bishop MRN: [id number] DOB: 02-19-1936    ADMISSION DATE:  02/25/2016 CONSULTATION DATE:    REFERRING MD:  EDP  CHIEF COMPLAINT:  PEA arrest, ? stroke  HISTORY OF PRESENT ILLNESS:   Mr. Vi is a 19M who presented to the ED via EMS. His wife reports that he was sitting on the porch and felt funny and told her he though he had had another stroke. She went to call EMS and returned to find him drooling and slumped over. EMS arrived and shortly thereafter he began vomiting. EMS had to suction his mouth / throat. Once they got him in the ambulance, he became bradycardic and had a PEA arrest. He required multiple rounds of epinephrine and apparently had to be resuscitated twice.  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 No other recent symptoms - no fever / chills / nausea / vomiting / diarrhea / cough / shortness of breath / sputum / chest pain. She says he did not complain of chest pain earlier today.   In the ED, ETT placement was confirmed, a RIJ catheter was placed, he was moving around a lot and was started on fentanyl and propofol for sedation. Epinephrine was required to maintain his blood pressure.   SUBJECTIVE:  No events overnight.  VITAL SIGNS: BP (!) 151/78 (BP Location: Left Arm)   Pulse 74   Temp 98.2 F (36.8 C) (Core (Comment))   Resp 11   Ht 6' (1.829 m)   Wt 87.4 kg (192 lb 10.9 oz)   SpO2 100%   BMI 26.13 kg/m   HEMODYNAMICS: CVP:  [3 mmHg-7 mmHg] 5 mmHg  VENTILATOR SETTINGS: Vent Mode: PRVC FiO2 (%):  [30 %] 30 % Set Rate:  [11 bmp-16 bmp] 11 bmp Vt Set:  [784 mL] 620 mL PEEP:  [5 cmH20] 5 cmH20 Plateau Pressure:  [10 cmH20-19 cmH20] 19 cmH20  INTAKE / OUTPUT: I/O last 3 completed shifts: In: 30 [I.V.:480; NG/GT:2461; IV Piggyback:1135] Out: 1740 [Urine:1740]  PHYSICAL EXAMINATION:  General Well nourished, well developed, intubated,  off sedation, withdraws to pain but not command and tracks.  HEENT No gross abnormalities.   Pulmonary Clear to auscultation bilaterally with no wheezes, rales or ronchi. Good effort, symmetrical expansion.   Cardiovascular Normal rate, regular rhythm. S1, s2. No m/r/g. Distal pulses palpable.  Abdomen Soft, non-tender, non-distended, positive bowel sounds, no palpable organomegaly or masses. Normoresonant to percussion.  Musculoskeletal Normal bulk and tone  Lymphatics No cervical, supraclavicular or axillary adenopathy.   Neurologic PERRL, corneals intact, + cough, +gag, spontaneous movement LLE, withdraws to pain  Skin/Integuement No rash, no cyanosis, no clubbing.    LABS:  BMET  Recent Labs Lab 03/04/16 0500 03/05/16 0450 03/06/16 0335  NA 138 142 139  K 4.0 3.5 3.2*  CL 107 110 105  CO2 24 26 22   BUN 13 22* 18  CREATININE 1.09 1.14 0.96  GLUCOSE 128* 141* 135*   Electrolytes  Recent Labs Lab 03/04/16 0500  03/05/16 0450 03/05/16 1645 03/06/16 0335  CALCIUM 8.4*  --  8.8*  --  8.7*  MG  --   < > 2.1 1.8 1.8  PHOS  --   < > 2.1* 4.9* 2.8  < > = values in this interval not displayed. CBC  Recent Labs Lab 03/04/16 0539 03/05/16 0450 03/06/16 0335  WBC 7.1 5.4 4.6  HGB 12.2*  10.7* 10.1*  HCT 39.7 34.7* 32.3*  PLT 140* 111* 108*   Coag's No results for input(s): APTT, INR in the last 168 hours.  Sepsis Markers  Recent Labs Lab 03/07/16 1853 2016-03-07 2300  LATICACIDVEN 7.27* 6.9*   ABG  Recent Labs Lab 03/05/16 0337 03/06/16 0318 03/06/16 0730  PHART 7.499* 7.542* 7.454*  PCO2ART 33.5 27.6* 37.6  PO2ART 106 369* 94.2   Liver Enzymes  Recent Labs Lab March 07, 2016 1901 03/04/16 0500 03/06/16 0335  AST 266* 182* 45*  ALT 233* 201* 92*  ALKPHOS 80 69 60  BILITOT 0.9 1.1 1.1  ALBUMIN 3.6 3.2* 2.7*   Cardiac Enzymes  Recent Labs Lab 07-Mar-2016 2347 03/04/16 0539  TROPONINI 0.32* 0.62*   Glucose  Recent Labs Lab 03/05/16 1138  03/05/16 1533 03/05/16 2104 03/06/16 0039 03/06/16 0333 03/06/16 0736  GLUCAP 155* 112* 123* 126* 129* 126*   Imaging Dg Chest Port 1 View  Result Date: 03/06/2016 CLINICAL DATA:  Endotracheal tube EXAM: PORTABLE CHEST 1 VIEW COMPARISON:  Yesterday FINDINGS: Endotracheal tube tip between the clavicular heads and carina. An orogastric tube reaches the stomach. Right IJ central line with tip at the SVC level. Increased bibasilar opacity which in places has a streaky appearance. No generalized edema. No effusion or pneumothorax. IMPRESSION: 1. Stable positioning of tubes and central line. 2. Increased bibasilar atelectasis or pneumonia. Electronically Signed   By: Marnee Spring M.D.   On: 03/06/2016 07:22   STUDIES:  CT head pending  CULTURES: Sputum 9/25>>>GPC Urine 9/25>>>NTD Blood 9/25>>>NTD  ANTIBIOTICS: Unasyn 9/26>>>  SIGNIFICANT EVENTS:   LINES/TUBES: ETT 9/24 >> RIJ CVC 9/24 >> Foley 9/24 >>  DISCUSSION: Mr. Trevor Bishop is a 51M with prior history of stroke who presented to the ED after experiencing a cardiac arrest. He was on the porch, felt strange, and told his wife he thought he had had another stroke. EMS was called and after their arrival he started vomiting, had to be suctioned, then became brady and went PEA. The story is suggestive of a primary neurologic event followed by massive aspiration and PEA arrest. After arrival to the ED he was moving spontaneously and required a good bit of sedation. I did not feel that cooling was appropriate in this setting.   ASSESSMENT / PLAN:  PULMONARY A: Acute respiratory failure requiring intubation Probable aspiration event P:   Continue ventilatory support Hold off PS trials given partial seizures Sputum cultures Unasyn for ?aspiration PNA  CARDIOVASCULAR A:  Cardiac arrest (brady to PEA); EKG unchanged since 01/2016 (t-wave inversion in V1 persists) Shock  P:  No cooling TTE not done, will repeat Maintain MAP >  65 No need for cardiology consult given that this was a respiratory arrest and troponins are only 0.62  NEUROLOGIC A:   Concern for acute stroke  Recent pontine stroke on ASA/Plavix P:   RASS goal: -1. Head CT not significant. Neuro consult appreciated, EEG today. D/C propofol and fentanyl. Continuous EEG.  RENAL A:   Acute kidney injury (Cr 1.34 from 0.93 8/22) P:   BMET in AM. Replace electrolytes as indicated.  KVO IVF. Strict I/Os. Maintain MAP > 65.  GASTROINTESTINAL A:   Shock liver - improving. P:   Stress ulcer ppx Trend LFTs Nutrition consult for TF  HEMATOLOGIC A:   No acute issues  P:  SCD's SQ heparin  INFECTIOUS A:   No acute issues Probable aspiration event P:   Unasyn as above F/u on cultures.  ENDOCRINE A:  No acute issues P:   Monitor CBG SSI if needed  FAMILY  - Updates: Family updated bedside, sister to arrange for a family meeting in AM for plan of care.  - Inter-disciplinary family meet or Palliative Care meeting due by:  day 7  The patient is critically ill with multiple organ systems failure and requires high complexity decision making for assessment and support, frequent evaluation and titration of therapies, application of advanced monitoring technologies and extensive interpretation of multiple databases.   Critical Care Time devoted to patient care services described in this note is  35  Minutes. This time reflects time of care of this signee Dr Koren BoundWesam Yacoub. This critical care time does not reflect procedure time, or teaching time or supervisory time of PA/NP/Med student/Med Resident etc but could involve care discussion time.  Alyson ReedyWesam G. Yacoub, M.D. [facility]. Pager: [phone]. After hours pager: [phone].  03/06/2016, 9:29 AM

## 2016-03-06 NOTE — Progress Notes (Signed)
Nutrition Follow-up  INTERVENTION:   D/C Vital High Protein  Vital AF 1.2 @ 70 ml/hr (1680 ml) Provides: 2016 kcal, 126 grams protein, and 1362 ml H2O>   NUTRITION DIAGNOSIS:   Inadequate oral intake related to inability to eat as evidenced by NPO status. Ongoing.   GOAL:   Patient will meet greater than or equal to 90% of their needs Progressing.   MONITOR:   TF tolerance, I & O's, Labs, Vent status  REASON FOR ASSESSMENT:   Consult Enteral/tube feeding initiation and management  ASSESSMENT:   Pt with hx of recent stroke (8/17) admitted after vomiting and PEA arrest. Pt not cooled.   Patient is currently intubated on ventilator support MV: 10.1 L/min Temp (24hrs), Avg:99 F (37.2 C), Min:97.3 F (36.3 C), Max:100.2 F (37.9 C)  Labs reviewed: K+ 3.2 CBG's: 126-137 NG tube, tip in stomach Propofol d/c'ed Concern for ongoing seizures   Diet Order:  Diet NPO time specified  Skin:  Reviewed, no issues  Last BM:  unknown  Height:   Ht Readings from Last 1 Encounters:  Jul 30, 2015 6' (1.829 m)    Weight:   Wt Readings from Last 1 Encounters:  03/06/16 192 lb 10.9 oz (87.4 kg)    Ideal Body Weight:  80.9 kg  BMI:  Body mass index is 26.13 kg/m.  Estimated Nutritional Needs:   Kcal:  2004  Protein:  104-115 grams  Fluid:  > 1.8 L/day  EDUCATION NEEDS:   No education needs identified at this time  Kendell BaneHeather Jodeen Mclin RD, LDN, CNSC [phone] Pager [phone] After Hours Pager

## 2016-03-06 NOTE — Progress Notes (Signed)
Routine EEG converted into LTM, LTM up and running

## 2016-03-07 ENCOUNTER — Inpatient Hospital Stay (HOSPITAL_COMMUNITY): Payer: Commercial Managed Care - HMO

## 2016-03-07 ENCOUNTER — Other Ambulatory Visit (HOSPITAL_COMMUNITY): Payer: Commercial Managed Care - HMO

## 2016-03-07 LAB — GLUCOSE, CAPILLARY
GLUCOSE-CAPILLARY: 137 mg/dL — AB (ref 65–99)
GLUCOSE-CAPILLARY: 113 mg/dL — AB (ref 65–99)
GLUCOSE-CAPILLARY: 119 mg/dL — AB (ref 65–99)
Glucose-Capillary: 124 mg/dL — ABNORMAL HIGH (ref 65–99)
Glucose-Capillary: 104 mg/dL — ABNORMAL HIGH (ref 65–99)
Glucose-Capillary: 125 mg/dL — ABNORMAL HIGH (ref 65–99)

## 2016-03-07 LAB — CBC
HCT: 31.7 % — ABNORMAL LOW (ref 39.0–52.0)
Hemoglobin: 9.7 g/dL — ABNORMAL LOW (ref 13.0–17.0)
MCH: 25.7 pg — ABNORMAL LOW (ref 26.0–34.0)
MCHC: 30.6 g/dL (ref 30.0–36.0)
MCV: 83.9 fL (ref 78.0–100.0)
PLATELETS: 103 10*3/uL — AB (ref 150–400)
RBC: 3.78 MIL/uL — ABNORMAL LOW (ref 4.22–5.81)
RDW: 15.7 % — AB (ref 11.5–15.5)
WBC: 3.7 10*3/uL — AB (ref 4.0–10.5)

## 2016-03-07 LAB — BLOOD GAS, ARTERIAL
Acid-Base Excess: 2.2 mmol/L — ABNORMAL HIGH (ref 0.0–2.0)
Bicarbonate: 26.1 mmol/L (ref 20.0–28.0)
DRAWN BY: 418751
FIO2: 30
O2 Saturation: 98.3 %
PATIENT TEMPERATURE: 98.6
PCO2 ART: 40.1 mmHg (ref 32.0–48.0)
PEEP: 5 cmH2O
RATE: 11 resp/min
VT: 620 mL
pH, Arterial: 7.43 (ref 7.350–7.450)
pO2, Arterial: 123 mmHg — ABNORMAL HIGH (ref 83.0–108.0)

## 2016-03-07 LAB — BASIC METABOLIC PANEL
ANION GAP: 4 — AB (ref 5–15)
BUN: 13 mg/dL (ref 6–20)
CALCIUM: 8.5 mg/dL — AB (ref 8.9–10.3)
CO2: 28 mmol/L (ref 22–32)
CREATININE: 0.79 mg/dL (ref 0.61–1.24)
Chloride: 108 mmol/L (ref 101–111)
GLUCOSE: 141 mg/dL — AB (ref 65–99)
Potassium: 3.7 mmol/L (ref 3.5–5.1)
Sodium: 140 mmol/L (ref 135–145)

## 2016-03-07 LAB — MAGNESIUM: Magnesium: 1.8 mg/dL (ref 1.7–2.4)

## 2016-03-07 LAB — PHOSPHORUS: PHOSPHORUS: 4.3 mg/dL (ref 2.5–4.6)

## 2016-03-07 NOTE — Progress Notes (Signed)
Neurology Progress Note  Subjective: He has been off sedation since 9/26, still intubated. This morning RN reports that he will track her around the room. He stuck his tongue out to command for her. No further seizure activity has been reported. He was on continuous EEG yesterday with no further seizure activity after his fosphenytoin load. Family at the bedside reports that they met with Dr. Nelda Marseille earlier this morning and were told that they needed to make a decision about whether to trach/PEG him or withdraw care by 1600 today. He is unable to provide ROS due to encephalopathy and ETT.   Current Meds:   Current Facility-Administered Medications:  .  0.9 %  sodium chloride infusion, 250 mL, Intravenous, PRN, Dannielle Burn, MD, Last Rate: 10 mL/hr at 03/07/16 0700, 250 mL at 03/07/16 0700 .  Ampicillin-Sulbactam (UNASYN) 3 g in sodium chloride 0.9 % 100 mL IVPB, 3 g, Intravenous, Q8H, Lyndee Leo, RPH, 3 g at 03/07/16 1660 .  chlorhexidine gluconate (MEDLINE KIT) (PERIDEX) 0.12 % solution 15 mL, 15 mL, Mouth Rinse, BID, Rush Farmer, MD, 15 mL at 03/07/16 0735 .  famotidine (PEPCID) 40 MG/5ML suspension 20 mg, 20 mg, Per Tube, BID, Dannielle Burn, MD, 20 mg at 03/07/16 1049 .  feeding supplement (VITAL AF 1.2 CAL) liquid 1,000 mL, 1,000 mL, Per Tube, Continuous, Rush Farmer, MD, Last Rate: 70 mL/hr at 03/07/16 0700, 1,000 mL at 03/07/16 0700 .  heparin injection 5,000 Units, 5,000 Units, Subcutaneous, Q8H, Rush Farmer, MD, 5,000 Units at 03/07/16 0510 .  insulin aspart (novoLOG) injection 2-6 Units, 2-6 Units, Subcutaneous, Q4H, Collene Gobble, MD, 2 Units at 03/07/16 [phone] .  levETIRAcetam (KEPPRA) 1,000 mg in sodium chloride 0.9 % 100 mL IVPB, 1,000 mg, Intravenous, Q12H, Darrel Reach, MD, 1,000 mg at 03/07/16 0800 .  MEDLINE mouth rinse, 15 mL, Mouth Rinse, 10 times per day, Rush Farmer, MD, 15 mL at 03/07/16 1052 .  phenytoin (DILANTIN) injection 100 mg, 100 mg,  Intravenous, Q8H, Darrel Reach, MD, 100 mg at 03/07/16 0511  Objective:  Temp:  [97.5 F (36.4 C)-100 F (37.8 C)] 98.8 F (37.1 C) (09/28 1100) Pulse Rate:  [65-77] 74 (09/28 1100) Resp:  [10-19] 10 (09/28 1100) BP: (106-148)/(54-74) 148/74 (09/28 1100) SpO2:  [97 %-100 %] 100 % (09/28 1100) FiO2 (%):  [30 %-40 %] 40 % (09/28 0747) Weight:  [87.8 kg (193 lb 9 oz)] 87.8 kg (193 lb 9 oz) (09/28 0316)  General:Intubated, currently no sedation. His eyes are open. He fixes and tracks on me and on family members as they enter the room and greet him. He smiled once when a visitor said something to him. He attempted to stick out his tongue on command for me. He was also able to blink a specific number of times on command and moved his eyes right, left, and up on command. He seems to fatigue easily, however, so commands become inconsistent. HEENT: Neck is supple without lymphadenopathy. ETT, OGT in place. Sclerae are anicteric and mildly edematous. There is mild conjunctival injection.  CV: Regular, distant, no obvious murmur.  Lungs: CTAB  Neuro: MS: As noted above. CN: Pupils are equal and reactive from 3-->2 mm bilaterally. He moves his eyes up, right and left to command with mild endgaze nystagmus in all directions. Corneals are present, much stronger on the left side. His face appears grossly symmetric but is partly obscured by tubes and tape. The remainder of  his cranial nerves cannot be accurately assessed as he does not participate with the exam.   Motor: Normal bulk. Increased tone on the left. No spontaneous movement of the limbs is observed. No myoclonus or other abnormal movements are seen.  Sensation: He has brisk semi-purposeful flexion to nailbed pressure in BLE. There is flexion withdrawal with nailbed pressure in BUE.  DTRs: 3+, brisker on the L. Toes are upgoing bilaterally.  Coordination and gait: These cannot be assessed as he is not able to participate.   Labs: Lab  Results  Component Value Date   WBC 3.7 (L) 03/07/2016   HGB 9.7 (L) 03/07/2016   HCT 31.7 (L) 03/07/2016   PLT 103 (L) 03/07/2016   GLUCOSE 141 (H) 03/07/2016   CHOL 102 01/30/2016   TRIG 66 01/30/2016   HDL 32 (L) 01/30/2016   LDLCALC 57 01/30/2016   ALT 92 (H) 03/06/2016   AST 45 (H) 03/06/2016   NA 140 03/07/2016   K 3.7 03/07/2016   CL 108 03/07/2016   CREATININE 0.79 03/07/2016   BUN 13 03/07/2016   CO2 28 03/07/2016   INR 1.13 01/29/2016   HGBA1C 5.7 (H) 01/30/2016   CBC Latest Ref Rng & Units 03/07/2016 03/06/2016 03/05/2016  WBC 4.0 - 10.5 K/uL 3.7(L) 4.6 5.4  Hemoglobin 13.0 - 17.0 g/dL 9.7(L) 10.1(L) 10.7(L)  Hematocrit 39.0 - 52.0 % 31.7(L) 32.3(L) 34.7(L)  Platelets 150 - 400 K/uL 103(L) 108(L) 111(L)    Lab Results  Component Value Date   HGBA1C 5.7 (H) 01/30/2016   Lab Results  Component Value Date   ALT 92 (H) 03/06/2016   AST 45 (H) 03/06/2016   ALKPHOS 60 03/06/2016   BILITOT 1.1 03/06/2016   Total phenytoin level 9.8-->corrected level is 15.3 based on albumin of 2.7  Brief review of cEEG this morning shows mild diffuse generalized slowing with background of theta intermixed with delta. No further seizures or overt epileptiform activity were noted on the portion I reviewed.   A/P:   1. Anoxic brain injury: This is acute, due to cardiac arrest. Initial rhythm was noted to be PEA. He had immediate resuscitative efforts by EMS as the arrest occurred in their presence. Treatment at this point is supportive as noted below. Tonight will be 96 hours out from his resuscitation. He did not receive therapeutic hypothermia.  2. 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 This has been compounded by the presence of seizure activity which likely slowed his ability to demonstrate any recovery. This morning he is following simple commands with the eyes and face, nothing in the extremities right now. He is also fixing and  tracking consistently and appeared to smile at a visitor when she teased him. Seizures have now resolved. Continue supportive care. Avoid hypoxemia and hypotension for even brief intervals as these are associated with worse neurologic outcomes. Fever and hyperglycemia must be aggressively treated for the same reason. We will continue to follow the exam to aid with neurologic prognosis but remain cautiously optimistic for meaningful recovery if seizures can be controlled.   3. Seizures: This is consistent with a severe anoxic brain injury. These have resolved after his fosphenytoin load yesterday. Continue Keppra and phenytoin. Continuous EEG was discontinued earlier today.   He is following commands this morning, is fixing and tracking consistently, and appears to be interacting with visitors. This represents improvement in his neurologic status compared to yesterday. He may well show ongoing neurologic improvement from  here now that his seizures have been successfully treated. I would favor continued supportive care, deferring trach and PEG for a few more days and holding off on withdrawal of life-sustaining measures at this time.   I met with several family members who were visiting at the bedside at the time of my visit, including three daughters and a cousin, and shared the above information with them. They are in favor of continued support in light of the fact that he is now following commands. They were advised that we may still come back to the same discussion of trach/PEG vs withdrawal if he fails to improve further and they understand. They were given the opportunity to ask any questions and these were addressed to their satisfaction.   I attempted to contact Dr. Nelda Marseille via pager but have not heard back yet. I will update him when I reach him.   This patient is critically ill and at significant risk of neurological worsening, death and care requires constant monitoring of vital signs,  hemodynamics,respiratory and cardiac monitoring, neurological assessment, discussion with family, other specialists and medical decision making of high complexity. A total of 50 minutes of critical care time was spent on this case.   Melba Coon, MD [facility]   1710--Tried again to contact the patient's daughter, Lannette Donath. Still no answer so unable to connect. Will try again tomorrow.

## 2016-03-07 NOTE — Progress Notes (Signed)
LTM taken down per Dr. Roxy Mannsster. No skin breakdown seen.

## 2016-03-07 NOTE — Progress Notes (Signed)
PULMONARY / CRITICAL CARE MEDICINE   Name: Gwenevere GhaziJesse R Arif MRN: [id number] DOB: 1936-06-06    ADMISSION DATE:  12-29-2015 CONSULTATION DATE:    REFERRING MD:  EDP  CHIEF COMPLAINT:  PEA arrest, ? stroke  HISTORY OF PRESENT ILLNESS:   [name] is a 62M who presented to the ED via EMS. His wife reports that he was sitting on the porch and felt funny and told her he though he had had another stroke. She went to call EMS and returned to find him drooling and slumped over. EMS arrived and shortly thereafter he began vomiting. EMS had to suction his mouth / throat. Once they got him in the ambulance, he became bradycardic and had a PEA arrest. He required multiple rounds of epinephrine and apparently had to be resuscitated twice.  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 No other recent symptoms - no fever / chills / nausea / vomiting / diarrhea / cough / shortness of breath / sputum / chest pain. She says he did not complain of chest pain earlier today.   In the ED, ETT placement was confirmed, a RIJ catheter was placed, he was moving around a lot and was started on fentanyl and propofol for sedation. Epinephrine was required to maintain his blood pressure.   SUBJECTIVE:  No events overnight.  VITAL SIGNS: BP (!) 130/57   Pulse 74   Temp 98.6 F (37 C)   Resp 13   Ht 6' (1.829 m)   Wt 87.8 kg (193 lb 9 oz)   SpO2 100%   BMI 26.25 kg/m   HEMODYNAMICS: CVP:  [5 mmHg-10 mmHg] 10 mmHg  VENTILATOR SETTINGS: Vent Mode: PRVC FiO2 (%):  [30 %-40 %] 40 % Set Rate:  [11 bmp] 11 bmp Vt Set:  [528[620 mL] 620 mL PEEP:  [5 cmH20] 5 cmH20 Plateau Pressure:  [16 cmH20-19 cmH20] 19 cmH20  INTAKE / OUTPUT: I/O last 3 completed shifts: In: 3623.2 [I.V.:350; NG/GT:2325.7; IV Piggyback:947.5] Out: 2935 [Urine:2935]  PHYSICAL EXAMINATION:  General Well nourished, well developed, intubated, off sedation, withdraws to pain but not  command and tracks.  HEENT No gross abnormalities.   Pulmonary Clear to auscultation bilaterally with no wheezes, rales or ronchi. Good effort, symmetrical expansion.   Cardiovascular Normal rate, regular rhythm. S1, s2. No m/r/g. Distal pulses palpable.  Abdomen Soft, non-tender, non-distended, positive bowel sounds, no palpable organomegaly or masses. Normoresonant to percussion.  Musculoskeletal Normal bulk and tone  Lymphatics No cervical, supraclavicular or axillary adenopathy.   Neurologic PERRL, corneals intact, + cough, +gag, spontaneous movement LLE, withdraws to pain  Skin/Integuement No rash, no cyanosis, no clubbing.    LABS:  BMET  Recent Labs Lab 03/05/16 0450 03/06/16 0335 03/07/16 0500  NA 142 139 140  K 3.5 3.2* 3.7  CL 110 105 108  CO2 26 22 28   BUN 22* 18 13  CREATININE 1.14 0.96 0.79  GLUCOSE 141* 135* 141*   Electrolytes  Recent Labs Lab 03/05/16 0450  03/06/16 0335 03/06/16 1714 03/07/16 0500  CALCIUM 8.8*  --  8.7*  --  8.5*  MG 2.1  < > 1.8 2.0 1.8  PHOS 2.1*  < > 2.8 4.3 4.3  < > = values in this interval not displayed. CBC  Recent Labs Lab 03/05/16 0450 03/06/16 0335 03/07/16 0500  WBC 5.4 4.6 3.7*  HGB 10.7* 10.1* 9.7*  HCT 34.7* 32.3* 31.7*  PLT 111*  108* 103*   Coag's No results for input(s): APTT, INR in the last 168 hours.  Sepsis Markers  Recent Labs Lab 02/09/2016 1853 02/22/2016 2300  LATICACIDVEN 7.27* 6.9*   ABG  Recent Labs Lab 03/06/16 0318 03/06/16 0730 03/07/16 0432  PHART 7.542* 7.454* 7.430  PCO2ART 27.6* 37.6 40.1  PO2ART 369* 94.2 123*   Liver Enzymes  Recent Labs Lab 03/07/2016 1901 03/04/16 0500 03/06/16 0335  AST 266* 182* 45*  ALT 233* 201* 92*  ALKPHOS 80 69 60  BILITOT 0.9 1.1 1.1  ALBUMIN 3.6 3.2* 2.7*   Cardiac Enzymes  Recent Labs Lab 02/18/2016 2347 03/04/16 0539  TROPONINI 0.32* 0.62*   Glucose  Recent Labs Lab 03/06/16 1223 03/06/16 1657 03/06/16 1931 03/06/16 2326  03/07/16 0311 03/07/16 0743  GLUCAP 137* 122* 120* 109* 137* 113*   Imaging Dg Chest Port 1 View  Result Date: 03/07/2016 CLINICAL DATA:  Intubation. EXAM: PORTABLE CHEST 1 VIEW COMPARISON:  03/06/2016.  03/05/2016. FINDINGS: Endotracheal tube tip is at the level of the orifice of the right mainstem bronchus. Proximal repositioning of 2 cm suggested. NG tube tip noted below left hemidiaphragm. Right IJ catheter noted in the superior vena cava. Bibasilar atelectasis and/or infiltrates. No interim change. Cardiomegaly. IMPRESSION: 1. Endotracheal tube tip is at the level of the orifice of the right mainstem bronchus. Proximal repositioning of 2 cm suggested. NG tube and right IJ line stable position. 2. Low lung volumes with bibasilar atelectasis and/or infiltrates. No interim change. 3. Stable cardiomegaly. Critical Value/emergent results were called by telephone at the time of interpretation on 03/07/2016 at 7:11 am to nurse Dorene Grebe, who verbally acknowledged these results. Electronically Signed   By: Maisie Fus  Register   On: 03/07/2016 07:13   STUDIES:  CT head pending  CULTURES: Sputum 9/25>>>GPC Urine 9/25>>>NTD Blood 9/25>>>NTD  ANTIBIOTICS: Unasyn 9/26>>>  SIGNIFICANT EVENTS:   LINES/TUBES: ETT 9/24 >> RIJ CVC 9/24 >> Foley 9/24 >>  DISCUSSION: Mr. Kadlec is a 24M with prior history of stroke who presented to the ED after experiencing a cardiac arrest. He was on the porch, felt strange, and told his wife he thought he had had another stroke. EMS was called and after their arrival he started vomiting, had to be suctioned, then became brady and went PEA. The story is suggestive of a primary neurologic event followed by massive aspiration and PEA arrest. After arrival to the ED he was moving spontaneously and required a good bit of sedation. I did not feel that cooling was appropriate in this setting.   ASSESSMENT / PLAN:  PULMONARY A: Acute respiratory failure requiring  intubation Probable aspiration event P:   Continue ventilatory support Hold off PS trials given partial seizures Sputum cultures Unasyn for ?aspiration PNA  CARDIOVASCULAR A:  Cardiac arrest (brady to PEA); EKG unchanged since 01/2016 (t-wave inversion in V1 persists) Shock  P:  No cooling TTE not done, will repeat Maintain MAP > 65 No need for cardiology consult given that this was a respiratory arrest and troponins are only 0.62  NEUROLOGIC A:   Concern for acute stroke  Recent pontine stroke on ASA/Plavix P:   RASS goal: -1. Head CT not significant. Neuro consult appreciated, EEG pending. D/C propofol and fentanyl. Continuous EEG.  RENAL A:   Acute kidney injury (Cr 1.34 from 0.93 8/22) P:   BMET in AM. Replace electrolytes as indicated.  KVO IVF. Strict I/Os. Maintain MAP > 65.  GASTROINTESTINAL A:   Shock liver - improving. P:  Stress ulcer ppx Trend LFTs Nutrition consult for TF  HEMATOLOGIC A:   No acute issues  P:  SCD's SQ heparin  INFECTIOUS A:   No acute issues Probable aspiration event P:   Unasyn as above F/u on cultures.  ENDOCRINE A:   No acute issues P:   Monitor CBG SSI if needed  FAMILY  - Updates: Spoke with cousin and daughter today, gave the option of trach/peg vs withdrawal.  They are meeting together as a family and will let us know later on today.  - Inter-disciplinary family meet or Palliative Care meeting due by:  day 7  The patient is critically ill with multiple organ systems failure and requires high complexity decision making for assessment and support, frequent evaluation and titration of therapies, application of advanced monitoring technologies and extensive interpretation of multiple databases.   Critical Care Time devoted to patient care services described in this note is  35  Minutes. This time reflects time of care of this signee Dr Koren Bound. This critical care time does not reflect procedure time, or  teaching time or supervisory time of PA/NP/Med student/Med Resident etc but could involve care discussion time.  Alyson Reedy, M.D. [facility] Pulmonary/Critical Care Medicine. Pager: [phone]. After hours pager: [phone].  03/07/2016, 10:12 AM

## 2016-03-07 NOTE — Procedures (Signed)
Electroencephalogram (EEG) Report  Date of study: 03/06/2016-03/07/2016  Day 1    Brief clinical history: 80 year old man with cardiac arrest resulting in anoxic brain injury with clinical and subclinical seizures.   Medications:  As per EMR   The background activates were marked by 5-6 cps theta at time reaching 7-8 cps poorly sustained at those frequencies.  Admixed broadly distributed delta slowing was also a significant part of background activities.   Superimposed spikes were present in the pericentral regions right lateralization, max negativity at C4. Brief electrographic seizures were arising from that  region during first half of the recording.    Clinical interpretation: This intensive EEG monitoring with  stimulations EEG monitoring was abnormal for two reasons. 1. Mild background activities slowing c/w mild encephalopathy of non specific etiologies.  2. Pericentral mostly right pericentral spikes and brief electrographic seizures were present during first half of the recoding and gradual improvement.  Suggestive of neuronal dysfunction and cortical irritability in the right fronto central cortex.

## 2016-03-08 ENCOUNTER — Inpatient Hospital Stay (HOSPITAL_COMMUNITY): Payer: Commercial Managed Care - HMO

## 2016-03-08 ENCOUNTER — Other Ambulatory Visit (HOSPITAL_COMMUNITY): Payer: Commercial Managed Care - HMO

## 2016-03-08 LAB — BLOOD GAS, ARTERIAL
ACID-BASE EXCESS: 3.7 mmol/L — AB (ref 0.0–2.0)
BICARBONATE: 27.4 mmol/L (ref 20.0–28.0)
DRAWN BY: 36496
FIO2: 30
LHR: 11 {breaths}/min
MECHVT: 620 mL
O2 Saturation: 98.5 %
PEEP/CPAP: 5 cmH2O
Patient temperature: 98.6
pCO2 arterial: 38.6 mmHg (ref 32.0–48.0)
pH, Arterial: 7.464 — ABNORMAL HIGH (ref 7.350–7.450)
pO2, Arterial: 120 mmHg — ABNORMAL HIGH (ref 83.0–108.0)

## 2016-03-08 LAB — GLUCOSE, CAPILLARY
GLUCOSE-CAPILLARY: 119 mg/dL — AB (ref 65–99)
GLUCOSE-CAPILLARY: 131 mg/dL — AB (ref 65–99)
GLUCOSE-CAPILLARY: 106 mg/dL — AB (ref 65–99)

## 2016-03-08 LAB — BASIC METABOLIC PANEL
Anion gap: 6 (ref 5–15)
BUN: 12 mg/dL (ref 6–20)
CHLORIDE: 106 mmol/L (ref 101–111)
CO2: 28 mmol/L (ref 22–32)
Calcium: 8.7 mg/dL — ABNORMAL LOW (ref 8.9–10.3)
Creatinine, Ser: 0.74 mg/dL (ref 0.61–1.24)
Glucose, Bld: 127 mg/dL — ABNORMAL HIGH (ref 65–99)
POTASSIUM: 3.2 mmol/L — AB (ref 3.5–5.1)
Sodium: 140 mmol/L (ref 135–145)

## 2016-03-08 LAB — CBC
HEMATOCRIT: 32.8 % — AB (ref 39.0–52.0)
HEMOGLOBIN: 10.1 g/dL — AB (ref 13.0–17.0)
MCH: 25.8 pg — AB (ref 26.0–34.0)
MCHC: 30.8 g/dL (ref 30.0–36.0)
MCV: 83.7 fL (ref 78.0–100.0)
Platelets: 121 10*3/uL — ABNORMAL LOW (ref 150–400)
RBC: 3.92 MIL/uL — ABNORMAL LOW (ref 4.22–5.81)
RDW: 15.5 % (ref 11.5–15.5)
WBC: 3.9 10*3/uL — ABNORMAL LOW (ref 4.0–10.5)

## 2016-03-08 LAB — MAGNESIUM: MAGNESIUM: 1.9 mg/dL (ref 1.7–2.4)

## 2016-03-08 LAB — PHOSPHORUS: PHOSPHORUS: 3.3 mg/dL (ref 2.5–4.6)

## 2016-03-08 MED ORDER — CLOPIDOGREL BISULFATE 75 MG PO TABS
75.0000 mg | ORAL_TABLET | ORAL | Status: DC
  Administered 2016-03-08: 75 mg
  Filled 2016-03-08: qty 1

## 2016-03-08 MED ORDER — POTASSIUM CHLORIDE 20 MEQ/15ML (10%) PO SOLN
30.0000 meq | ORAL | Status: AC
  Administered 2016-03-08 (×2): 30 meq
  Filled 2016-03-08 (×2): qty 30

## 2016-03-08 MED ORDER — POTASSIUM CHLORIDE 20 MEQ/15ML (10%) PO SOLN
40.0000 meq | Freq: Three times a day (TID) | ORAL | Status: DC
  Administered 2016-03-08: 40 meq
  Filled 2016-03-08: qty 30

## 2016-03-08 MED ORDER — ACETAMINOPHEN 160 MG/5ML PO SOLN
650.0000 mg | Freq: Four times a day (QID) | ORAL | Status: DC | PRN
  Administered 2016-03-08: 650 mg
  Filled 2016-03-08: qty 20.3

## 2016-03-08 MED ORDER — ATROPINE SULFATE 1 % OP SOLN
2.0000 [drp] | OPHTHALMIC | Status: DC | PRN
  Administered 2016-03-08: 2 [drp] via SUBLINGUAL
  Filled 2016-03-08: qty 2

## 2016-03-08 MED ORDER — ASPIRIN 81 MG PO CHEW
81.0000 mg | CHEWABLE_TABLET | ORAL | Status: DC
  Administered 2016-03-08: 81 mg
  Filled 2016-03-08: qty 1

## 2016-03-08 MED ORDER — MAGNESIUM SULFATE 2 GM/50ML IV SOLN
2.0000 g | Freq: Once | INTRAVENOUS | Status: AC
  Administered 2016-03-08: 2 g via INTRAVENOUS
  Filled 2016-03-08: qty 50

## 2016-03-08 MED ORDER — GLYCOPYRROLATE 0.2 MG/ML IJ SOLN
0.1000 mg | Freq: Once | INTRAMUSCULAR | Status: AC
  Administered 2016-03-08: 0.1 mg via INTRAVENOUS
  Filled 2016-03-08: qty 0.5

## 2016-03-09 LAB — CULTURE, BLOOD (ROUTINE X 2)
CULTURE: NO GROWTH
Culture: NO GROWTH

## 2016-03-10 NOTE — Progress Notes (Signed)
[facility] FOR [facility] LAB REPLACEMENT ONLY  The patient does [facility] the [facility] based on the criteria listed below:   1. Is GFR >/= 40 ml/min? Yes.    Patient's GFR today is >60 2. Is urine output >/= 0.5 ml/kg/hr for the last 6 hours? Yes.   Patient's UOP is 1.14 ml/kg/hr 3. Is BUN < 60 mg/dL? Yes.    Patient's BUN today is 12 4. Abnormal electrolyte(s):Potassium 3.2 5. Ordered repletion with: Potassium per protocol 6. If a panic level lab has been reported, has the CCM MD in charge been notified? No..   Physician:    Orland DecLANTZY, Zanaria Morell P 02/23/2016 5:10 AM

## 2016-03-10 NOTE — Progress Notes (Signed)
Pharmacy Antibiotic Note  Gwenevere GhaziJesse R Jicha is a 80 y.o. male admitted on 02/23/2016 with possible aspiration pneumonia. Pt s/p PEA arrest, found drooling and slumped over so concern for aspiration. Pharmacy has been consulted for unasyn dosing. Currently afebrile, WBC 3.9, renal function stable.   Plan: -Continue Unasyn 3g IV q8 hours -Assess clinical status, SCr, CBC daily -Follow-up length of therapy   Height: 6' (182.9 cm) Weight: 193 lb 2 oz (87.6 kg) IBW/kg (Calculated) : 77.6  Temp (24hrs), Avg:98.7 F (37.1 C), Min:97.7 F (36.5 C), Max:99.3 F (37.4 C)   Recent Labs Lab 03/02/2016 1853  02/27/2016 2300  03/04/16 0500 03/04/16 0539 03/05/16 0450 03/06/16 0335 03/07/16 0500 07-Oct-2015 0335  WBC  --   < >  --   --   --  7.1 5.4 4.6 3.7* 3.9*  CREATININE 1.10  < >  --   < > 1.09  --  1.14 0.96 0.79 0.74  LATICACIDVEN 7.27*  --  6.9*  --   --   --   --   --   --   --   < > = values in this interval not displayed.  Estimated Creatinine Clearance: 84.9 mL/min (by C-G formula based on SCr of 0.74 mg/dL).    No Known Allergies  Antimicrobials this admission: Unasyn 9/26 >>    Dose adjustments this admission: n/a  Microbiology results: 9/25 BCx: ngtd x2 9/25 UCx: ngtd 9/25 Trach Aspirate: normal respiratory flora 9/24 MRSA PCR: neg  Thank you for allowing pharmacy to be a part of this patient's care.  Fredonia HighlandMichael Bitonti, PharmD PGY-1 Pharmacy Resident Pager: [phone] 2015-10-10

## 2016-03-10 NOTE — Progress Notes (Signed)
Spoke with family extensively.  After discussion, they do not wish for patient have a trach.  They wish for one way extubation.  Will extubate and if patient deteriorates then will start morphine drip.  Alyson ReedyWesam G. Laythan Hayter, M.D. [facility] Pulmonary/Critical Care Medicine. Pager: [phone]. After hours pager: [phone].

## 2016-03-10 NOTE — Procedures (Signed)
Extubation Procedure Note  Patient Details:   Name: Trevor GhaziJesse R Zeiger DOB: 10-18-1935 MRN: [id number]   Airway Documentation:  Airway 7.5 mm (Active)  Secured at (cm) 25 cm 02/18/2016  8:33 AM  Measured From Lips 02/12/2016  8:33 AM  Secured Location Center 03/09/2016  8:33 AM  Secured By [employer] Tube Holder 02/14/2016  8:33 AM  Tube Holder Repositioned Yes 02/23/2016  8:33 AM  Cuff Pressure (cm H2O) 26 cm H2O 02/09/2016  3:47 AM  Site Condition Dry 03/05/2016  8:33 AM    Evaluation  O2 sats: currently acceptable Complications: No apparent complications Patient did tolerate procedure well. Bilateral Breath Sounds: Rhonchi   No  Toula MoosCampbell, Cashus Halterman Faulkner 02/15/2016, 2:50 PM

## 2016-03-10 NOTE — Progress Notes (Signed)
PULMONARY / CRITICAL CARE MEDICINE   Name: Trevor GhaziJesse R Bishop MRN: [id number] DOB: 1935/08/05    ADMISSION DATE:  02/20/2016 CONSULTATION DATE:    REFERRING MD:  EDP  CHIEF COMPLAINT:  PEA arrest, ? stroke  HISTORY OF PRESENT ILLNESS:   Trevor Bishop is a 79M who presented to the ED via EMS. His wife reports that he was sitting on the porch and felt funny and told her he though he had had another stroke. She went to call EMS and returned to find him drooling and slumped over. EMS arrived and shortly thereafter he began vomiting. EMS had to suction his mouth / throat. Once they got him in the ambulance, he became bradycardic and had a PEA arrest. He required multiple rounds of epinephrine and apparently had to be resuscitated twice.  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 No other recent symptoms - no fever / chills / nausea / vomiting / diarrhea / cough / shortness of breath / sputum / chest pain. She says he did not complain of chest pain earlier today.   In the ED, ETT placement was confirmed, a RIJ catheter was placed, he was moving around a lot and was started on fentanyl and propofol for sedation. Epinephrine was required to maintain his blood pressure.   SUBJECTIVE:  No events overnight, not following commands.  VITAL SIGNS: BP (!) 123/58   Pulse 77   Temp 99.1 F (37.3 C)   Resp 14   Ht 6' (1.829 m)   Wt 87.6 kg (193 lb 2 oz)   SpO2 100%   BMI 26.19 kg/m   HEMODYNAMICS: CVP:  [5 mmHg-10 mmHg] 6 mmHg  VENTILATOR SETTINGS: Vent Mode: PRVC FiO2 (%):  [30 %] 30 % Set Rate:  [11 bmp] 11 bmp Vt Set:  [295[620 mL] 620 mL PEEP:  [5 cmH20] 5 cmH20 Pressure Support:  [5 cmH20] 5 cmH20 Plateau Pressure:  [14 cmH20-20 cmH20] 14 cmH20  INTAKE / OUTPUT: I/O last 3 completed shifts: In: 3690 [I.V.:350; NG/GT:2610; IV Piggyback:730] Out: 2525 [Urine:2525]  PHYSICAL EXAMINATION:  General Well nourished, well developed,  intubated, off sedation, withdraws to pain but not command and tracks.  HEENT No gross abnormalities.   Pulmonary Clear to auscultation bilaterally with no wheezes, rales or ronchi. Good effort, symmetrical expansion.   Cardiovascular Normal rate, regular rhythm. S1, s2. No m/r/g. Distal pulses palpable.  Abdomen Soft, non-tender, non-distended, positive bowel sounds, no palpable organomegaly or masses. Normoresonant to percussion.  Musculoskeletal Normal bulk and tone  Lymphatics No cervical, supraclavicular or axillary adenopathy.   Neurologic PERRL, corneals intact, + cough, +gag, spontaneous movement LLE, withdraws to pain  Skin/Integuement No rash, no cyanosis, no clubbing.    LABS:  BMET  Recent Labs Lab 03/06/16 0335 03/07/16 0500 08-Oct-2015 0335  NA 139 140 140  K 3.2* 3.7 3.2*  CL 105 108 106  CO2 22 28 28   BUN 18 13 12   CREATININE 0.96 0.79 0.74  GLUCOSE 135* 141* 127*   Electrolytes  Recent Labs Lab 03/06/16 0335 03/06/16 1714 03/07/16 0500 08-Oct-2015 0335  CALCIUM 8.7*  --  8.5* 8.7*  MG 1.8 2.0 1.8 1.9  PHOS 2.8 4.3 4.3 3.3   CBC  Recent Labs Lab 03/06/16 0335 03/07/16 0500 08-Oct-2015 0335  WBC 4.6 3.7* 3.9*  HGB 10.1* 9.7* 10.1*  HCT 32.3* 31.7* 32.8*  PLT 108* 103* 121*   Coag's No results for input(s):  APTT, INR in the last 168 hours.  Sepsis Markers  Recent Labs Lab 24-Mar-2016 1853 2016/03/24 2300  LATICACIDVEN 7.27* 6.9*   ABG  Recent Labs Lab 03/06/16 0730 03/07/16 0432 02/23/2016 0421  PHART 7.454* 7.430 7.464*  PCO2ART 37.6 40.1 38.6  PO2ART 94.2 123* 120*   Liver Enzymes  Recent Labs Lab 2016-03-24 1901 03/04/16 0500 03/06/16 0335  AST 266* 182* 45*  ALT 233* 201* 92*  ALKPHOS 80 69 60  BILITOT 0.9 1.1 1.1  ALBUMIN 3.6 3.2* 2.7*   Cardiac Enzymes  Recent Labs Lab 2016/03/24 2347 03/04/16 0539  TROPONINI 0.32* 0.62*   Glucose  Recent Labs Lab 03/07/16 1301 03/07/16 1617 03/07/16 1941 03/07/16 2325 02/15/2016 0334  02/19/2016 0728  GLUCAP 124* 104* 119* 125* 119* 131*   Imaging Dg Chest Port 1 View  Result Date: 02/17/2016 CLINICAL DATA:  Intubation. EXAM: PORTABLE CHEST 1 VIEW COMPARISON:  03/07/2016.  03/05/2016.  03/04/2016. FINDINGS: Endotracheal tube, NG tube, and right IJ line stable position. Progressive right perihilar infiltrate and/or atelectasis noted. Low lung volumes with bibasilar atelectasis. No pleural effusion or pneumothorax. Heart size stable. IMPRESSION: 1. Lines and tubes in stable position. 2. Progressive right perihilar atelectasis and/or infiltrate. Low lung volumes. Electronically Signed   By: Maisie Fus  Register   On: 02/09/2016 06:40   STUDIES:  CT head pending  CULTURES: Sputum 9/25>>>GPC Urine 9/25>>>NTD Blood 9/25>>>NTD  ANTIBIOTICS: Unasyn 9/26>>>  SIGNIFICANT EVENTS:   LINES/TUBES: ETT 9/24 >> RIJ CVC 9/24 >> Foley 9/24 >>  DISCUSSION: [name] is a 35M with prior history of stroke who presented to the ED after experiencing a cardiac arrest. He was on the porch, felt strange, and told his wife he thought he had had another stroke. EMS was called and after their arrival he started vomiting, had to be suctioned, then became brady and went PEA. The story is suggestive of a primary neurologic event followed by massive aspiration and PEA arrest. After arrival to the ED he was moving spontaneously and required a good bit of sedation. I did not feel that cooling was appropriate in this setting.   ASSESSMENT / PLAN:  PULMONARY A: Acute respiratory failure requiring intubation Probable aspiration event P:   Begin PS trials. Sputum cultures. Unasyn for ?aspiration PNA  CARDIOVASCULAR A:  Cardiac arrest (brady to PEA); EKG unchanged since 01/2016 (t-wave inversion in V1 persists) Shock  P:  TTE not done, will repeat Maintain MAP > 65 No need for cardiology consult given that this was a respiratory arrest and troponins are only 0.62  NEUROLOGIC A:   Concern  for acute stroke  Recent pontine stroke on ASA/Plavix P:   RASS goal: -1. Head CT not significant. Neuro consult appreciated, EEG per neuro. D/C propofol and fentanyl.  RENAL A:   Acute kidney injury (Cr 1.34 from 0.93 8/22) P:   BMET in AM. Replace electrolytes as indicated.  KVO IVF. Strict I/Os. Maintain MAP > 65.  GASTROINTESTINAL A:   Shock liver - improving. P:   Stress ulcer ppx Trend LFTs Nutrition consult for TF  HEMATOLOGIC A:   No acute issues  P:  SCD's SQ heparin  INFECTIOUS A:   No acute issues Probable aspiration event P:   Unasyn as above F/u on cultures.  ENDOCRINE A:   No acute issues P:   Monitor CBG SSI if needed  FAMILY  - Updates: Family updated bedside, awaiting decision on trach/peg.  - Inter-disciplinary family meet or Palliative Care meeting due by:  day 7  The patient is critically ill with multiple organ systems failure and requires high complexity decision making for assessment and support, frequent evaluation and titration of therapies, application of advanced monitoring technologies and extensive interpretation of multiple databases.   Critical Care Time devoted to patient care services described in this note is  35  Minutes. This time reflects time of care of this signee Dr Koren Bound. This critical care time does not reflect procedure time, or teaching time or supervisory time of PA/NP/Med student/Med Resident etc but could involve care discussion time.  Alyson Reedy, M.D. [facility] Pulmonary/Critical Care Medicine. Pager: [phone]. After hours pager: [phone].  03/07/2016, 10:06 AM

## 2016-03-10 DEATH — deceased

## 2016-03-13 ENCOUNTER — Telehealth: Payer: Self-pay

## 2016-03-13 NOTE — Telephone Encounter (Signed)
On 03/13/2016 I received a death certificate from [employer] (original). The death certificate is for cremation. The patient is a patient of Doctor Molli KnockYacoub. [facility] tomorrow am (03/14/2016) for signature. On 03/15/2016 I received the death certificate back from Doctor Molli KnockYacoub. I got the death certificate ready and called the funeral home to let them know the death certificate is ready for pickup. I also faxed a copy to the funeral home per the funeral home request.

## 2016-04-02 ENCOUNTER — Ambulatory Visit: Payer: Commercial Managed Care - HMO | Admitting: Nurse Practitioner

## 2016-04-10 NOTE — Discharge Summary (Signed)
NAMKoren Bound:  Nathanson, Kentrel                 ACCOUNT NO.:  [id number]  MEDICAL RECORD NO.:  [id number]  LOCATION:  2H07C                        FACILITY:  [facility]  PHYSICIAN:  Felipa EvenerWesam Jake Jermine Bibbee, MD  DATE OF BIRTH:  1936/05/05  DATE OF ADMISSION:  Oct 23, 2015 DATE OF DISCHARGE:  02/20/2016                              DISCHARGE SUMMARY   DEATH SUMMARY  FINAL DIAGNOSIS/CAUSE OF DEATH:  Pulseless activity cardiac arrest.  SECONDARY DIAGNOSES: 1. Acute respiratory failure. 2. Acute kidney injury. 3. Shocked liver. 4. Probable aspiration event.  HISTORY/HOSPITAL COURSE:  The patient is a 80 year old male with extensive past medical history, presented to the hospital via EMS after his wife reported finding him sitting on the porch and patient told her he felt like he is having another stroke.  EMS was called.  Upon their arrival, he was slumped over, found to be in pulseless activity cardiac arrest.  The patient was resuscitated and brought to the emergency department, [facility].  The patient was found to have refractory shock and decision was made to not cool the patient at that time.  After 5 days in the intensive care unit, I was approached by the family.  After an extensive discussion, decision was made the patient would not want trach, PEG, and given how extensive his neurologic damage is, he would rather be comfortable.  Morphine was started.  The patient was extubated, expired shortly thereafter with the family at bedside.     Felipa EvenerWesam Jake Delmos Velaquez, MD     WJY/MEDQ  D:  04/07/2016  T:  04/08/2016  Job:  [id number]
